# Patient Record
Sex: Female | Born: 1985 | Race: White | Hispanic: No | Smoking: Former smoker
Health system: Southern US, Community
[De-identification: ages and names within clinical notes are randomized; demographics above are authoritative.]

## PROBLEM LIST (undated history)

## (undated) DIAGNOSIS — F419 Anxiety disorder, unspecified: Secondary | ICD-10-CM

## (undated) DIAGNOSIS — Z8619 Personal history of other infectious and parasitic diseases: Secondary | ICD-10-CM

## (undated) DIAGNOSIS — B019 Varicella without complication: Secondary | ICD-10-CM

## (undated) HISTORY — PX: TONSILLECTOMY: SUR1361

## (undated) HISTORY — DX: Personal history of other infectious and parasitic diseases: Z86.19

## (undated) HISTORY — DX: Anxiety disorder, unspecified: F41.9

## (undated) HISTORY — DX: Varicella without complication: B01.9

---

## 2004-12-03 ENCOUNTER — Emergency Department: Payer: Self-pay | Admitting: Emergency Medicine

## 2007-08-25 ENCOUNTER — Emergency Department: Payer: Self-pay | Admitting: Emergency Medicine

## 2008-05-19 ENCOUNTER — Emergency Department: Payer: Self-pay | Admitting: Internal Medicine

## 2009-04-17 ENCOUNTER — Emergency Department: Payer: Self-pay | Admitting: Emergency Medicine

## 2009-04-23 ENCOUNTER — Ambulatory Visit: Payer: Self-pay

## 2009-11-18 ENCOUNTER — Observation Stay: Payer: Self-pay

## 2009-11-19 ENCOUNTER — Inpatient Hospital Stay: Payer: Self-pay

## 2010-04-17 ENCOUNTER — Emergency Department: Payer: Self-pay | Admitting: Emergency Medicine

## 2012-04-22 ENCOUNTER — Emergency Department: Payer: Self-pay | Admitting: Emergency Medicine

## 2013-06-28 ENCOUNTER — Emergency Department: Payer: Self-pay | Admitting: Emergency Medicine

## 2014-01-23 ENCOUNTER — Ambulatory Visit: Payer: Self-pay | Admitting: Adult Health

## 2014-01-30 ENCOUNTER — Ambulatory Visit (INDEPENDENT_AMBULATORY_CARE_PROVIDER_SITE_OTHER): Payer: PRIVATE HEALTH INSURANCE | Admitting: Adult Health

## 2014-01-30 ENCOUNTER — Encounter (INDEPENDENT_AMBULATORY_CARE_PROVIDER_SITE_OTHER): Payer: Self-pay

## 2014-01-30 ENCOUNTER — Encounter: Payer: Self-pay | Admitting: Adult Health

## 2014-01-30 VITALS — BP 126/72 | HR 82 | Temp 98.0°F | Resp 12 | Ht 63.75 in | Wt 204.5 lb

## 2014-01-30 DIAGNOSIS — Z0000 Encounter for general adult medical examination without abnormal findings: Secondary | ICD-10-CM

## 2014-01-30 DIAGNOSIS — R3915 Urgency of urination: Secondary | ICD-10-CM

## 2014-01-30 DIAGNOSIS — F419 Anxiety disorder, unspecified: Secondary | ICD-10-CM

## 2014-01-30 DIAGNOSIS — N926 Irregular menstruation, unspecified: Secondary | ICD-10-CM

## 2014-01-30 DIAGNOSIS — E669 Obesity, unspecified: Secondary | ICD-10-CM

## 2014-01-30 DIAGNOSIS — F411 Generalized anxiety disorder: Secondary | ICD-10-CM

## 2014-01-30 LAB — CBC WITH DIFFERENTIAL/PLATELET
BASOS PCT: 0.8 % (ref 0.0–3.0)
Basophils Absolute: 0.1 10*3/uL (ref 0.0–0.1)
EOS PCT: 2.4 % (ref 0.0–5.0)
Eosinophils Absolute: 0.2 10*3/uL (ref 0.0–0.7)
HCT: 41.7 % (ref 36.0–46.0)
Hemoglobin: 13.8 g/dL (ref 12.0–15.0)
LYMPHS PCT: 27.7 % (ref 12.0–46.0)
Lymphs Abs: 1.8 10*3/uL (ref 0.7–4.0)
MCHC: 33.1 g/dL (ref 30.0–36.0)
MCV: 87.4 fl (ref 78.0–100.0)
Monocytes Absolute: 0.6 10*3/uL (ref 0.1–1.0)
Monocytes Relative: 9 % (ref 3.0–12.0)
Neutro Abs: 3.8 10*3/uL (ref 1.4–7.7)
Neutrophils Relative %: 60.1 % (ref 43.0–77.0)
Platelets: 236 10*3/uL (ref 150.0–400.0)
RBC: 4.77 Mil/uL (ref 3.87–5.11)
RDW: 12.3 % (ref 11.5–14.6)
WBC: 6.3 10*3/uL (ref 4.5–10.5)

## 2014-01-30 LAB — COMPREHENSIVE METABOLIC PANEL
ALT: 16 U/L (ref 0–35)
AST: 17 U/L (ref 0–37)
Albumin: 4.1 g/dL (ref 3.5–5.2)
Alkaline Phosphatase: 57 U/L (ref 39–117)
BILIRUBIN TOTAL: 0.6 mg/dL (ref 0.3–1.2)
BUN: 10 mg/dL (ref 6–23)
CALCIUM: 9.4 mg/dL (ref 8.4–10.5)
CHLORIDE: 101 meq/L (ref 96–112)
CO2: 26 meq/L (ref 19–32)
Creatinine, Ser: 0.6 mg/dL (ref 0.4–1.2)
GFR: 127.13 mL/min (ref >60.00)
Glucose, Bld: 83 mg/dL (ref 70–99)
POTASSIUM: 4 meq/L (ref 3.5–5.1)
SODIUM: 135 meq/L (ref 135–145)
TOTAL PROTEIN: 7 g/dL (ref 6.0–8.3)

## 2014-01-30 LAB — POCT URINALYSIS DIPSTICK
BILIRUBIN UA: NEGATIVE
Glucose, UA: NEGATIVE
KETONES UA: NEGATIVE
Leukocytes, UA: NEGATIVE
Nitrite, UA: NEGATIVE
PH UA: 7
Protein, UA: NEGATIVE
SPEC GRAV UA: 1.025
Urobilinogen, UA: 0.2

## 2014-01-30 LAB — MAGNESIUM: MAGNESIUM: 1.8 mg/dL (ref 1.5–2.5)

## 2014-01-30 LAB — VITAMIN B12: Vitamin B-12: 999 pg/mL — ABNORMAL HIGH (ref 211–911)

## 2014-01-30 LAB — TSH: TSH: 3.48 u[IU]/mL (ref 0.35–5.50)

## 2014-01-30 MED ORDER — BUSPIRONE HCL 7.5 MG PO TABS: 7.5000 mg | ORAL_TABLET | Freq: Two times a day (BID) | ORAL | Status: DC | PRN

## 2014-01-30 NOTE — Progress Notes (Signed)
Patient ID: Pamela Schmidt, female   DOB: 29-Dec-1985, 28 y.o.   MRN: [id number]    Subjective:    Patient ID: Pamela Schmidt, female    DOB: 03/22/1986, 28 y.o.   MRN: [id number]  HPI  Pt is a pleasant 28 y/o female who presents to clinic to establish care. She has not had a PCP in years. She was followed at [facility] but has not been there in approximately 4 years. Last PAP at that time. She has a few concerns. Reports stressful work. Uncontrolled anxiety. She can be sitting quietly when she will feel increasing anxiety, heart races and gets tightness in her chest. No syncope. She uses breathing exercises to try to help her symptoms. She reports drinking 3-4 cans of diet soda per day.   She reports urinary frequency, urgency, nocturia. She has been having similar symptoms since her last pregnancy. She find that she is always running to the bathroom. No hematuria or dysuria. Strains to force urine out. When she first wakes up she urinates without any problems. Does not have to strain at that time.   Last period was the third week of January. Does not use any contraception. She reports having home pregnancy test which have been negative. She believes that her missed period is d/t stress.  Pt reports wanting to lose weight and having difficulty doing so. She has been taking diet pills. Reports that she does not feel jittery when she takes medication. Feels that this is helping her and would like to continue. Blood pressure not elevated.   Review of Systems  Constitutional:       Weight gain  HENT: Negative.   Eyes: Negative.   Respiratory: Negative.   Cardiovascular: Positive for palpitations (associated with anxiety).  Gastrointestinal: Negative.   Endocrine: Negative.   Genitourinary: Positive for urgency, frequency and menstrual problem (missed period). Negative for hematuria.       Hesitancy  Musculoskeletal: Negative.   Skin: Negative.   Allergic/Immunologic: Negative.   Neurological:  Negative.   Hematological: Negative.   Psychiatric/Behavioral: The patient is nervous/anxious (denies depression. Increasing stress at work.).        Objective:  There were no vitals taken for this visit.   Physical Exam  Constitutional: She is oriented to person, place, and time. She appears well-developed and well-nourished. No distress.  HENT:  Head: Normocephalic and atraumatic.  Right Ear: External ear normal.  Left Ear: External ear normal.  Nose: Nose normal.  Mouth/Throat: Oropharynx is clear and moist.  Eyes: Conjunctivae and EOM are normal. Pupils are equal, round, and reactive to light.  Neck: Normal range of motion. Neck supple. No tracheal deviation present. No thyromegaly present.  Cardiovascular: Normal rate, regular rhythm, normal heart sounds and intact distal pulses.  Exam reveals no gallop and no friction rub.   No murmur heard. Pulmonary/Chest: Effort normal and breath sounds normal. No respiratory distress. She has no wheezes. She has no rales.  Abdominal: Soft. Bowel sounds are normal. She exhibits no distension and no mass. There is no tenderness. There is no rebound and no guarding.  Musculoskeletal: Normal range of motion. She exhibits no edema and no tenderness.  Lymphadenopathy:    She has no cervical adenopathy.  Neurological: She is alert and oriented to person, place, and time. She has normal reflexes. No cranial nerve deficit. Coordination normal.  Skin: Skin is warm and dry.  Psychiatric: She has a normal mood and affect. Her behavior is normal. Judgment and thought  content normal.       Assessment & Plan:   1. Routine general medical examination at a health care facility Normal physical exam except for problems noted separately. Request records from her OB/GYN. Believes it has been 3-4 years since last PAP. Check routine labs.  - Vit D  25 hydroxy (rtn osteoporosis monitoring) - CBC with Differential - Vitamin B12 - Comprehensive metabolic  panel  2. Urgency of urination Reports problem ongoing since last pregnancy. ? Urethral stricture. Will check to see if she may have a uti causing worsening symptoms. If unknown etiology will refer to urology. - POCT urinalysis dipstick - Urine culture - Urinalysis, Routine w reflex microscopic  3. Anxiety Pt reports increasing stress at work. Check labs. Start buspar. Continue to follow - TSH - Magnesium - Comprehensive metabolic panel  4. Missed period Check for pregnancy. - hCG, serum, qualitative  5. Obesity Phendimetrazine tid. Discussed short term use of medication. Needs to develop new, healthy habits. Increase physical activity, portion control. Continue to follow

## 2014-01-30 NOTE — Progress Notes (Signed)
Pre visit review using our clinic review tool, if applicable. No additional management support is needed unless otherwise documented below in the visit note. 

## 2014-01-31 ENCOUNTER — Encounter: Payer: Self-pay | Admitting: Adult Health

## 2014-01-31 LAB — VITAMIN D 25 HYDROXY (VIT D DEFICIENCY, FRACTURES): Vit D, 25-Hydroxy: 46 ng/mL (ref 30–89)

## 2014-01-31 LAB — HCG, SERUM, QUALITATIVE: Preg, Serum: NEGATIVE

## 2014-02-01 ENCOUNTER — Encounter: Payer: Self-pay | Admitting: Adult Health

## 2014-02-01 DIAGNOSIS — E669 Obesity, unspecified: Secondary | ICD-10-CM | POA: Insufficient documentation

## 2014-02-01 DIAGNOSIS — Z0000 Encounter for general adult medical examination without abnormal findings: Secondary | ICD-10-CM | POA: Insufficient documentation

## 2014-02-01 DIAGNOSIS — F419 Anxiety disorder, unspecified: Secondary | ICD-10-CM | POA: Insufficient documentation

## 2014-02-01 DIAGNOSIS — N926 Irregular menstruation, unspecified: Secondary | ICD-10-CM | POA: Insufficient documentation

## 2014-02-01 DIAGNOSIS — R3915 Urgency of urination: Secondary | ICD-10-CM | POA: Insufficient documentation

## 2014-02-01 LAB — URINE CULTURE
Colony Count: NO GROWTH
Organism ID, Bacteria: NO GROWTH

## 2014-02-02 ENCOUNTER — Telehealth: Payer: Self-pay | Admitting: Adult Health

## 2014-02-02 ENCOUNTER — Telehealth: Payer: Self-pay | Admitting: *Deleted

## 2014-02-02 DIAGNOSIS — R3129 Other microscopic hematuria: Secondary | ICD-10-CM

## 2014-02-02 NOTE — Telephone Encounter (Signed)
Pt was sent a MyChart message requesting that she return to clinic to provide a urine sample to check for microscopic hematuria.

## 2014-02-02 NOTE — Telephone Encounter (Signed)
OK - thanks

## 2014-02-02 NOTE — Telephone Encounter (Signed)
I wasn't able to, it was either the culture or the micro there wasn't enough for both

## 2014-02-16 ENCOUNTER — Telehealth: Payer: Self-pay | Admitting: Adult Health

## 2014-02-16 NOTE — Telephone Encounter (Signed)
Pt was rx'd buspar at last visit.  States she is to come back Friday 3/27 for repeat urine sample.  Pt states she has 1 buspar left and would like a refill.  Pt states she does not want to run out.

## 2014-02-17 ENCOUNTER — Other Ambulatory Visit: Payer: Self-pay | Admitting: Adult Health

## 2014-02-17 MED ORDER — BUSPIRONE HCL 7.5 MG PO TABS: 7.5000 mg | ORAL_TABLET | Freq: Two times a day (BID) | ORAL | Status: DC | PRN

## 2014-02-17 MED ORDER — BUSPIRONE HCL 10 MG PO TABS: 10.0000 mg | ORAL_TABLET | Freq: Two times a day (BID) | ORAL | Status: DC

## 2014-02-17 NOTE — Progress Notes (Signed)
Called pharmacy and cancelled the 7.5mg  Rx

## 2014-02-20 ENCOUNTER — Other Ambulatory Visit: Payer: PRIVATE HEALTH INSURANCE

## 2014-03-06 ENCOUNTER — Encounter: Payer: Self-pay | Admitting: Adult Health

## 2014-03-06 ENCOUNTER — Ambulatory Visit (INDEPENDENT_AMBULATORY_CARE_PROVIDER_SITE_OTHER): Payer: PRIVATE HEALTH INSURANCE | Admitting: Adult Health

## 2014-03-06 VITALS — BP 118/70 | HR 75 | Temp 98.1°F | Wt 203.0 lb

## 2014-03-06 DIAGNOSIS — Z0000 Encounter for general adult medical examination without abnormal findings: Secondary | ICD-10-CM

## 2014-03-06 DIAGNOSIS — Z139 Encounter for screening, unspecified: Secondary | ICD-10-CM | POA: Insufficient documentation

## 2014-03-06 LAB — POCT URINE PREGNANCY: Preg Test, Ur: POSITIVE

## 2014-03-06 NOTE — Progress Notes (Signed)
Pre visit review using our clinic review tool, if applicable. No additional management support is needed unless otherwise documented below in the visit note. 

## 2014-03-06 NOTE — Progress Notes (Signed)
Patient ID: Pamela Schmidt, female   DOB: 05/07/1986, 28 y.o.   MRN: [id number]    Subjective:    Patient ID: Pamela Schmidt, female    DOB: 11-06-1986, 28 y.o.   MRN: [id number]  HPI  Pamela Schmidt is a pleasant 28 y/o female who presents to clinic for her yearly exam. She has missed her period and believes she may be pregnant. Took a home preg test which was positive but would like this rechecked. Doing well overall. Not taking phentermine. She is only using buspar as needed (category B).    Past Medical History  Diagnosis Date  . Chicken pox      Past Surgical History  Procedure Laterality Date  . Cesarean section  2005, 2010     Family History  Problem Relation Age of Onset  . Hypertension Mother   . Diverticulitis Mother   . Diabetes Father   . Diverticulitis Maternal Aunt   . Diverticulitis Maternal Grandmother   . Bipolar disorder Maternal Aunt      History   Social History  . Marital Status: Married    Spouse Name: N/A    Number of Children: N/A  . Years of Education: 14   Occupational History  . Dental Assistant     Pamela Schmidt, [place]   Social History Main Topics  . Smoking status: Former Smoker    Quit date: 01/30/2010  . Smokeless tobacco: Never Used  . Alcohol Use: 0.0 oz/week    3-4 Glasses of wine per week  . Drug Use: No  . Sexual Activity: Yes    Partners: Male   Other Topics Concern  . Not on file   Social History Narrative   Pamela Schmidt was born in [place] and then moved to [place] area when she was in middle school. She lives at home with her husband and children. She works as a Sales executive. She enjoys working outside in the yard and shopping.     Current Outpatient Prescriptions on File Prior to Visit  Medication Sig Dispense Refill  . busPIRone (BUSPAR) 10 MG tablet Take 1 tablet (10 mg total) by mouth 2 (two) times daily.  60 tablet  6  . Phendimetrazine Tartrate 35 MG TABS Take 1 tablet by mouth 3 (three) times daily.       No current  facility-administered medications on file prior to visit.     Review of Systems  Constitutional: Negative.   HENT: Negative.   Eyes: Negative.   Respiratory: Negative.   Cardiovascular: Negative.   Gastrointestinal: Negative.   Endocrine: Negative.   Genitourinary: Negative.   Musculoskeletal: Negative.   Skin: Negative.   Allergic/Immunologic: Negative.   Neurological: Negative.   Hematological: Negative.   Psychiatric/Behavioral: Negative.        Objective:  BP 118/70  Pulse 75  Temp(Src) 98.1 F (36.7 C) (Oral)  Wt 203 lb (92.08 kg)  SpO2 99%  LMP 01/31/2014   Physical Exam  Constitutional: She is oriented to person, place, and time. She appears well-developed and well-nourished. No distress.  HENT:  Head: Normocephalic and atraumatic.  Right Ear: External ear normal.  Left Ear: External ear normal.  Nose: Nose normal.  Mouth/Throat: Oropharynx is clear and moist.  Eyes: Conjunctivae and EOM are normal. Pupils are equal, round, and reactive to light.  Neck: Normal range of motion. Neck supple. No tracheal deviation present. No thyromegaly present.  Cardiovascular: Normal rate, regular rhythm, normal heart sounds and intact distal pulses.  Exam reveals no  gallop and no friction rub.   No murmur heard. Pulmonary/Chest: Effort normal and breath sounds normal. No respiratory distress. She has no wheezes. She has no rales.  Abdominal: Soft. Bowel sounds are normal. She exhibits no distension and no mass. There is no tenderness. There is no rebound and no guarding.  Musculoskeletal: Normal range of motion. She exhibits no edema and no tenderness.  Lymphadenopathy:    She has no cervical adenopathy.  Neurological: She is alert and oriented to person, place, and time. She has normal reflexes. No cranial nerve deficit. Coordination normal.  Skin: Skin is warm and dry.  Psychiatric: She has a normal mood and affect. Her behavior is normal. Judgment and thought content  normal.      Assessment & Plan:   1. Routine general medical examination at a health care facility Normal physical exam including breast. No pelvic/pap this visit. She will have this done at OB/GYN. Return in 1 year for physical and labs.  2. Screening Positive pregnancy. She will be setting up appointment with OB. Would like to try vaginal birth - h/o cessarian  - POCT urine pregnancy

## 2014-03-12 ENCOUNTER — Emergency Department: Payer: Self-pay | Admitting: Emergency Medicine

## 2014-03-12 LAB — CBC
HCT: 39 % (ref 35.0–47.0)
HGB: 12.8 g/dL (ref 12.0–16.0)
MCH: 28.9 pg (ref 26.0–34.0)
MCHC: 32.9 g/dL (ref 32.0–36.0)
MCV: 88 fL (ref 80–100)
Platelet: 215 10*3/uL (ref 150–440)
RBC: 4.44 10*6/uL (ref 3.80–5.20)
RDW: 12.5 % (ref 11.5–14.5)
WBC: 8.1 10*3/uL (ref 3.6–11.0)

## 2014-03-12 LAB — HCG, QUANTITATIVE, PREGNANCY: Beta Hcg, Quant.: 5352 m[IU]/mL — ABNORMAL HIGH

## 2014-09-05 DIAGNOSIS — Z810 Family history of intellectual disabilities: Secondary | ICD-10-CM | POA: Insufficient documentation

## 2014-09-05 DIAGNOSIS — Z98891 History of uterine scar from previous surgery: Secondary | ICD-10-CM | POA: Insufficient documentation

## 2014-09-05 DIAGNOSIS — IMO0002 Reserved for concepts with insufficient information to code with codable children: Secondary | ICD-10-CM | POA: Insufficient documentation

## 2014-11-10 DIAGNOSIS — Z3490 Encounter for supervision of normal pregnancy, unspecified, unspecified trimester: Secondary | ICD-10-CM | POA: Insufficient documentation

## 2015-05-06 ENCOUNTER — Ambulatory Visit: Payer: Self-pay | Admitting: Podiatry

## 2016-02-10 IMAGING — US US OB < 14 WEEKS - US OB TV
1 series · 14 of 28 positions shown · non-contrast
Comparison: None.

CLINICAL DATA: Cramping, vaginal bleeding

EXAM:
OBSTETRIC <14 WK US AND TRANSVAGINAL OB US
TECHNIQUE: Both transabdominal and transvaginal ultrasound examinations were
performed for complete evaluation of the gestation as well as the
maternal uterus, adnexal regions, and pelvic cul-de-sac.
Transvaginal technique was performed to assess early pregnancy.

[Series 1: us ob < 14 weeks - us ob tv · 0.21mm/px · 14 of 32 slices shown]
[im 2/32]
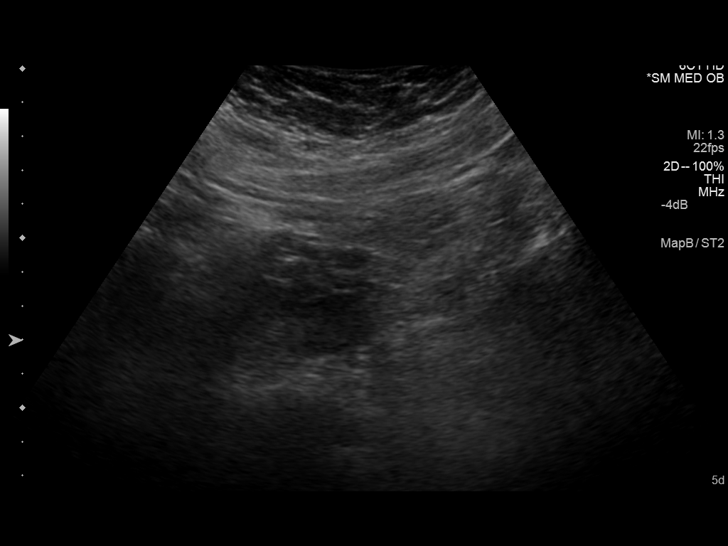
[im 4/32]
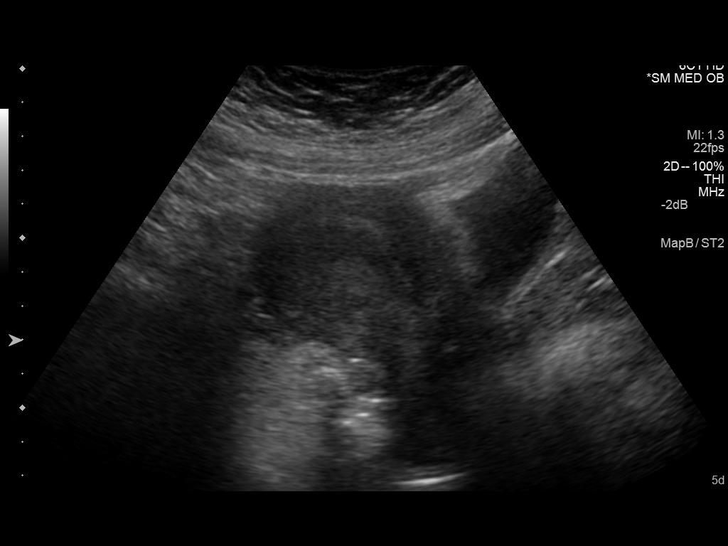
[im 6/32]
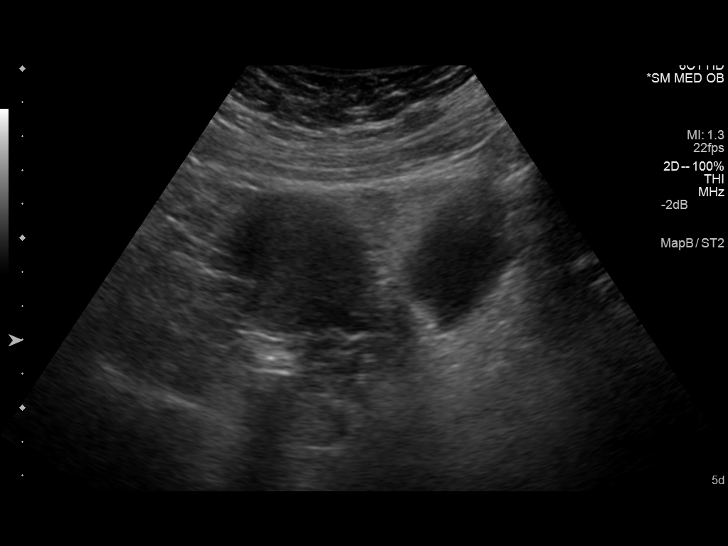
[im 9/32]
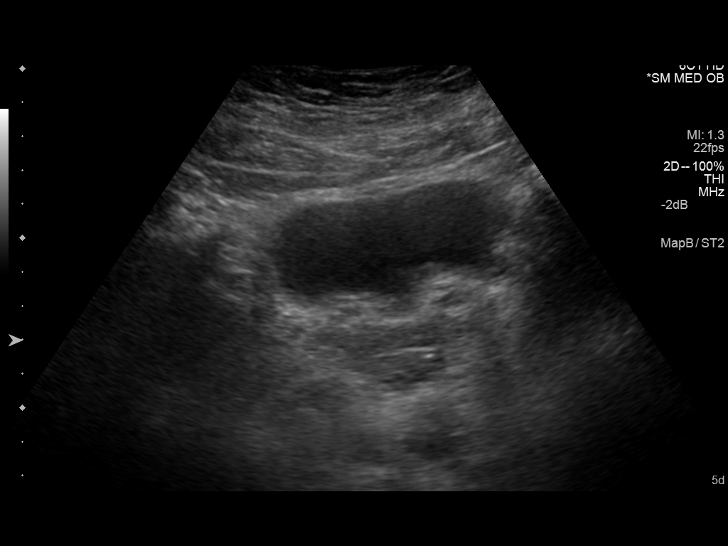
[im 11/32]
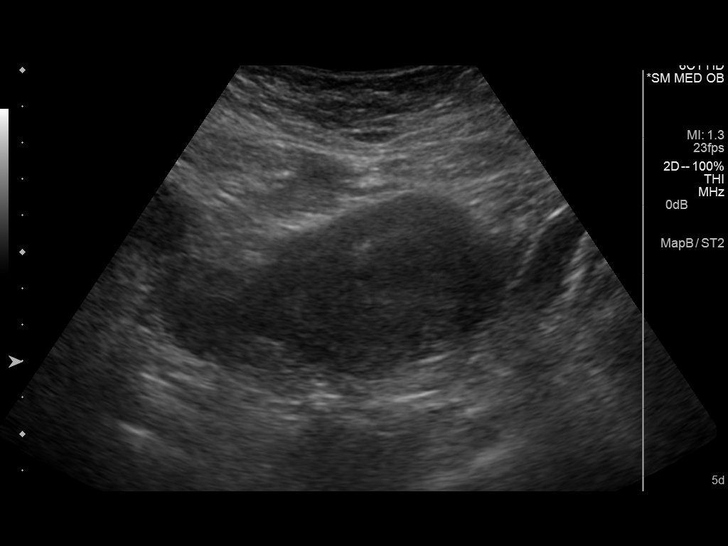
[im 13/32]
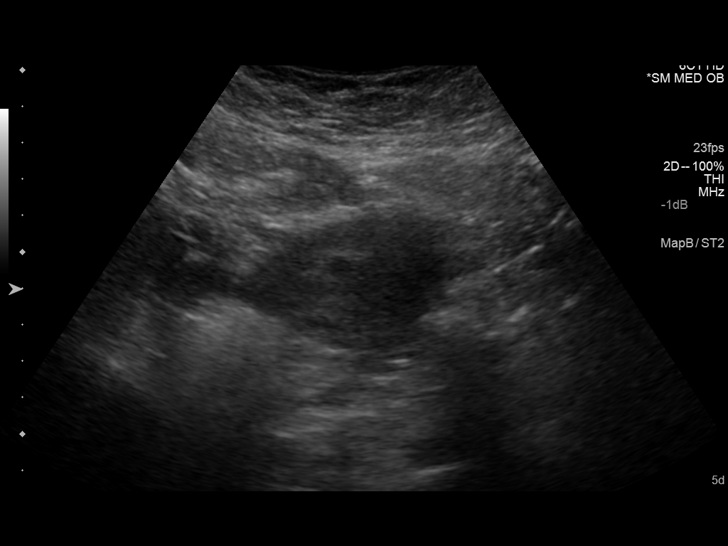
[im 15/32]
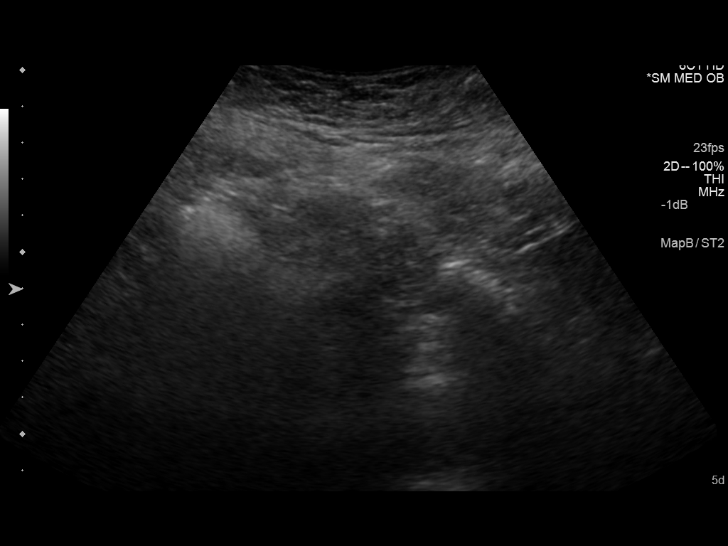
[im 18/32]
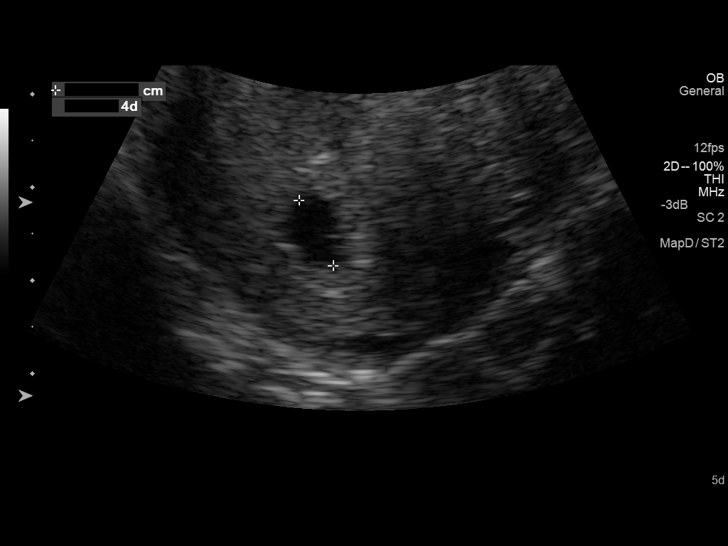
[im 20/32]
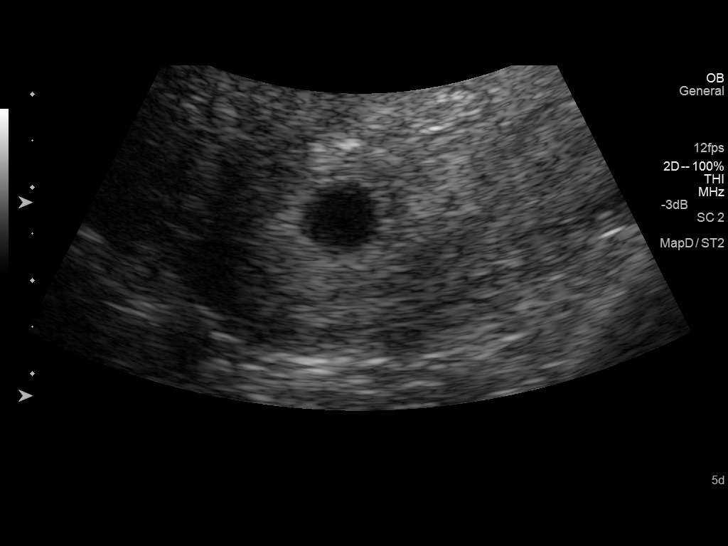
[im 22/32]
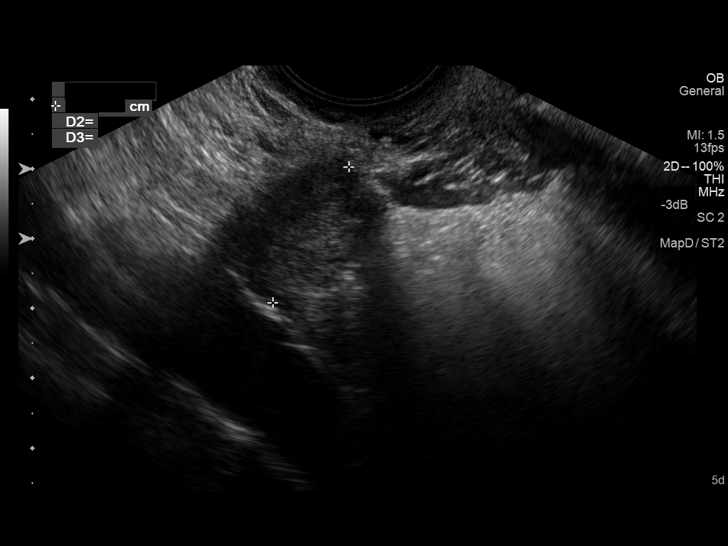
[im 25/32]
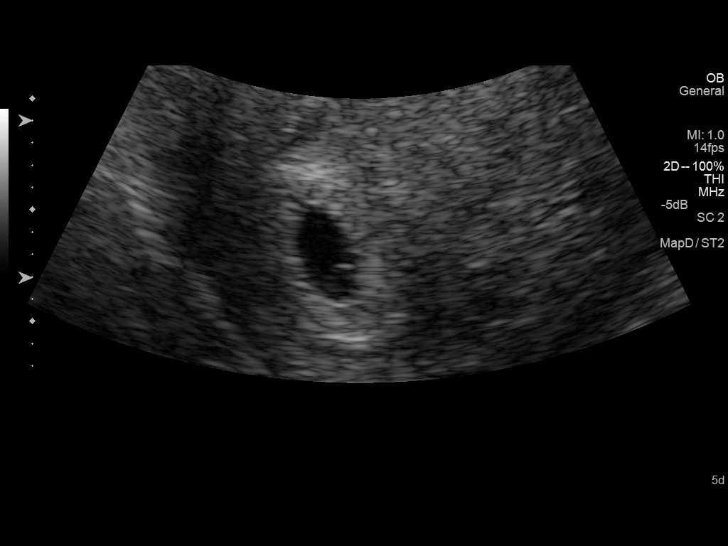
[im 27/32]
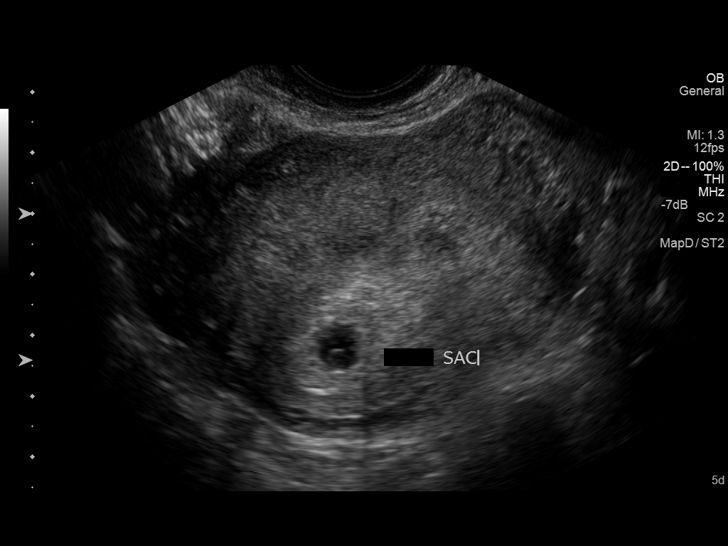
[im 29/32]
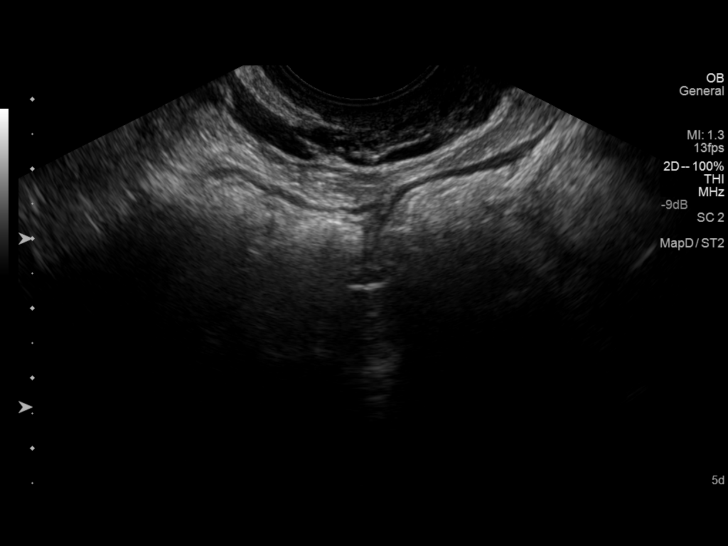
[im 32/32]
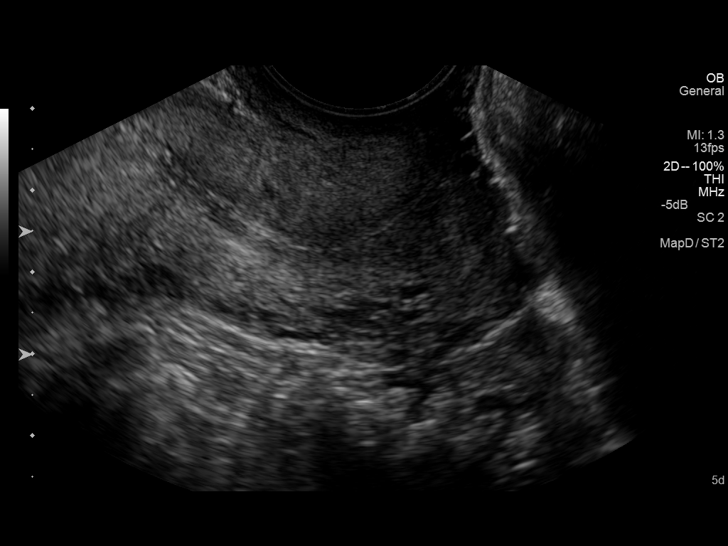

[14 of 28 positions shown; findings below may reference images not displayed]

FINDINGS: Intrauterine gestational sac: Visualized/normal in shape.

Yolk sac:  Present.

Embryo:  None.

Cardiac Activity: None.

MSD: 7.9 mm   5 w   4  d          US EDC: 11/08/2014

Maternal uterus/adnexae: The right ovary is normal in size and
echogenicity. The right ovary measures 2.2 x 2.4 x 2.1 cm. The left
ovary is not visualized. There is no pelvic free fluid.
IMPRESSION: Intrauterine gestational sac and yolk sac without a visualized fetal
pole. Differential diagnosis includes a blighted ovum versus early
pregnancy. Recommend clinical correlation, serial quantitative beta
HCGs, ectopic precautions, and followup ultrasound in 14 days.

## 2016-05-05 ENCOUNTER — Encounter: Payer: Self-pay | Admitting: Sports Medicine

## 2016-05-05 ENCOUNTER — Ambulatory Visit (INDEPENDENT_AMBULATORY_CARE_PROVIDER_SITE_OTHER): Payer: BLUE CROSS/BLUE SHIELD

## 2016-05-05 ENCOUNTER — Ambulatory Visit (INDEPENDENT_AMBULATORY_CARE_PROVIDER_SITE_OTHER): Payer: BLUE CROSS/BLUE SHIELD | Admitting: Sports Medicine

## 2016-05-05 DIAGNOSIS — M7732 Calcaneal spur, left foot: Secondary | ICD-10-CM | POA: Diagnosis not present

## 2016-05-05 DIAGNOSIS — M722 Plantar fascial fibromatosis: Secondary | ICD-10-CM | POA: Diagnosis not present

## 2016-05-05 DIAGNOSIS — M79672 Pain in left foot: Secondary | ICD-10-CM

## 2016-05-05 MED ORDER — METHYLPREDNISOLONE 4 MG PO TBPK: ORAL_TABLET | ORAL | Status: DC

## 2016-05-05 MED ORDER — DICLOFENAC SODIUM 75 MG PO TBEC: 75.0000 mg | DELAYED_RELEASE_TABLET | Freq: Two times a day (BID) | ORAL | Status: DC

## 2016-05-05 NOTE — Progress Notes (Signed)
Patient ID: Pamela Schmidt, female   DOB: 01/22/1986, 30 y.o.   MRN: [id number] Subjective: Pamela Schmidt is a 30 y.o. female patient presents to office with complaint of heel pain on the left. Patient admits to post static dyskinesia for 3 months in duration, 10/10. Patient has treated this problem with stretching, massage, and motrin with no relief. States that this all started after doing a P90x workout in flip flops. Denies any other pedal complaints.   Patient Active Problem List   Diagnosis Date Noted  . Currently pregnant 11/10/2014  . Family history of intellectual disabilities 09/05/2014  . History of cesarean section 09/05/2014  . Adult BMI 30+ 09/05/2014  . Screening 03/06/2014  . Routine general medical examination at a health care facility 02/01/2014  . Urgency of urination 02/01/2014  . Anxiety 02/01/2014  . Missed period 02/01/2014  . Obesity 02/01/2014    No current outpatient prescriptions on file prior to visit.   No current facility-administered medications on file prior to visit.    Allergies  Allergen Reactions  . Penicillin G Rash  . Penicillins Itching and Rash    Objective: Physical Exam General: The patient is alert and oriented x3 in no acute distress.  Dermatology: Skin is warm, dry and supple bilateral lower extremities. Nails 1-10 are normal. There is no erythema, edema, no eccymosis, no open lesions present. Integument is otherwise unremarkable.  Vascular: Dorsalis Pedis pulse and Posterior Tibial pulse are 2/4 bilateral. Capillary fill time is immediate to all digits.  Neurological: Grossly intact to light touch with an achilles reflex of +2/5 and a  negative Tinel's sign bilateral.  Musculoskeletal: Tenderness to palpation at the medial calcaneal tubercale and through the insertion of the plantar fascia on the left foot. No pain with compression of calcaneus bilateral. No pain with tuning fork to calcaneus bilateral. No pain with calf compression  bilateral. There is decreased Ankle joint range of motion bilateral. All other joints range of motion within normal limits bilateral. Strength 5/5 in all groups bilateral.   Xray, Left foot:  Normal osseous mineralization. Joint spaces preserved. No fracture/dislocation/boney destruction. Calcaneal spur present with mild thickening of plantar fascia. No other soft tissue abnormalities or radiopaque foreign bodies.   Assessment and Plan: Problem List Items Addressed This Visit    None    Visit Diagnoses    Left foot pain    -  Primary    Relevant Medications    methylPREDNISolone (MEDROL DOSEPAK) 4 MG TBPK tablet    diclofenac (VOLTAREN) 75 MG EC tablet    Other Relevant Orders    DG Foot 2 Views Left    Plantar fasciitis, left        Relevant Medications    methylPREDNISolone (MEDROL DOSEPAK) 4 MG TBPK tablet    diclofenac (VOLTAREN) 75 MG EC tablet    Heel spur, left          -Complete examination performed.  -Xrays reviewed -Discussed with patient in detail the condition of plantar fasciitis, how this occurs and general treatment options. Explained both conservative and surgical treatments.  -Patient will return for injection; had her children with her this visit -Rx Diclofenac to start after Medrol dose pack is completed -Recommended good supportive shoes and advised use of OTC insert. Explained to patient that if these orthoses work well, we will continue with these. If these do not improve her condition and  pain, we will consider custom molded orthoses. -Explained in detail the use of  the fascial brace for left which was dispensed at today's visit. -Explained and dispensed to patient daily stretching exercises. -Recommend patient to ice affected area 1-2x daily. -Patient to return to office in 2 weeks for follow up/injection or sooner if problems or questions arise.  Pamela Schmidt, DPM

## 2016-05-05 NOTE — Patient Instructions (Signed)

## 2016-05-17 ENCOUNTER — Telehealth: Payer: Self-pay | Admitting: Sports Medicine

## 2016-05-17 DIAGNOSIS — M79672 Pain in left foot: Secondary | ICD-10-CM

## 2016-05-17 DIAGNOSIS — M722 Plantar fascial fibromatosis: Secondary | ICD-10-CM

## 2016-05-17 MED ORDER — DICLOFENAC SODIUM 75 MG PO TBEC: 75.0000 mg | DELAYED_RELEASE_TABLET | Freq: Two times a day (BID) | ORAL | Status: DC

## 2016-05-17 NOTE — Addendum Note (Signed)
Addended by: Alphia Kava'CONNELL, Elisa Kutner D on: 05/17/2016 10:06 AM   Modules accepted: Orders

## 2016-05-17 NOTE — Telephone Encounter (Signed)
Pt is finished with her meds that Dr. Marylene LandStover put her on and her feet are hurting again wanting to know if she can get another refill on that medicine

## 2016-05-17 NOTE — Telephone Encounter (Addendum)
Pt continues to have foot pain after completing antiinflammatories.  Left message for pt to make sure she came to her appt, I had refilled the Diclofenac for 1 week and ice therapy.

## 2016-05-19 ENCOUNTER — Encounter: Payer: Self-pay | Admitting: Sports Medicine

## 2016-05-19 ENCOUNTER — Ambulatory Visit (INDEPENDENT_AMBULATORY_CARE_PROVIDER_SITE_OTHER): Payer: BLUE CROSS/BLUE SHIELD | Admitting: Sports Medicine

## 2016-05-19 DIAGNOSIS — M79672 Pain in left foot: Secondary | ICD-10-CM | POA: Diagnosis not present

## 2016-05-19 DIAGNOSIS — M722 Plantar fascial fibromatosis: Secondary | ICD-10-CM

## 2016-05-19 DIAGNOSIS — M7732 Calcaneal spur, left foot: Secondary | ICD-10-CM | POA: Diagnosis not present

## 2016-05-19 MED ORDER — TRIAMCINOLONE ACETONIDE 10 MG/ML IJ SUSP: 10.0000 mg | Freq: Once | INTRAMUSCULAR | Status: DC

## 2016-05-19 NOTE — Progress Notes (Signed)
Patient ID: Geoffry ParadiseAmber J Sanroman, female   DOB: 12/14/1985, 30 y.o.   MRN: [id number]  Subjective: Geoffry Paradisember J Clerk is a 30 y.o. female patient returns to office with complaint of heel pain on the left. Patient states that she felt great for about a week while she was on steroids medicine and when she went off her steroid medication then started to have some pain back to the level where it originally was. Reports that the diclofenac helps a little bit. However, did not feel as great as when she was on her steroid medicine. Patient is here today for steroid injection. Denies any other pedal complaints.   Patient Active Problem List   Diagnosis Date Noted  . Currently pregnant 11/10/2014  . Family history of intellectual disabilities 09/05/2014  . History of cesarean section 09/05/2014  . Adult BMI 30+ 09/05/2014  . Screening 03/06/2014  . Routine general medical examination at a health care facility 02/01/2014  . Urgency of urination 02/01/2014  . Anxiety 02/01/2014  . Missed period 02/01/2014  . Obesity 02/01/2014    Current Outpatient Prescriptions on File Prior to Visit  Medication Sig Dispense Refill  . diclofenac (VOLTAREN) 75 MG EC tablet Take 1 tablet (75 mg total) by mouth 2 (two) times daily. 30 tablet 0  . diclofenac (VOLTAREN) 75 MG EC tablet Take 1 tablet (75 mg total) by mouth 2 (two) times daily. 15 tablet 0  . methylPREDNISolone (MEDROL DOSEPAK) 4 MG TBPK tablet Take first as instructed 21 tablet 0   No current facility-administered medications on file prior to visit.    Allergies  Allergen Reactions  . Penicillin G Rash  . Penicillins Itching and Rash    Objective: Physical Exam General: The patient is alert and oriented x3 in no acute distress.  Dermatology: Skin is warm, dry and supple bilateral lower extremities. Nails 1-10 are normal. There is no erythema, edema, no eccymosis, no open lesions present. Integument is otherwise unremarkable.  Vascular: Dorsalis Pedis pulse  and Posterior Tibial pulse are 2/4 bilateral. Capillary fill time is immediate to all digits.  Neurological: Grossly intact to light touch with an achilles reflex of +2/5 and a  negative Tinel's sign bilateral.  Musculoskeletal: Tenderness to palpation at the medial calcaneal tubercale and through the insertion of the plantar fascia on the left foot. No pain with compression of calcaneus bilateral. No pain with tuning fork to calcaneus bilateral. No pain with calf compression bilateral. There is decreased Ankle joint range of motion bilateral. All other joints range of motion within normal limits bilateral. Strength 5/5 in all groups bilateral.    Assessment and Plan: Problem List Items Addressed This Visit    None    Visit Diagnoses    Plantar fasciitis, left    -  Primary    Relevant Medications    triamcinolone acetonide (KENALOG) 10 MG/ML injection 10 mg    Heel spur, left        Relevant Medications    triamcinolone acetonide (KENALOG) 10 MG/ML injection 10 mg    Left foot pain        Relevant Medications    triamcinolone acetonide (KENALOG) 10 MG/ML injection 10 mg      -Complete examination performed.  -Discussed with patient in detail the condition of plantar fasciitis, how this occurs and general treatment options. Explained both conservative and surgical treatments.  After oral consent and aseptic prep, injected a mixture containing 1 ml of 2%  plain lidocaine, 1 ml 0.5%  plain marcaine, 0.5 ml of kenalog 10 and 0.5 ml of dexamethasone phosphate into Left heel without complication. Post-injection care discussed with patient.  -Finish dose pack and diclofenac as prescribed -Recommended good supportive shoes and advised use of OTC insert. Explained to patient that if these orthoses work well, we will continue with these. If these do not improve her condition and  pain, we will consider custom molded orthoses. -Continue with plantar fascial brace may add night splint, If continues  to be symptomatic -Continue with daily stretching exercises. -Recommend patient to continue to ice affected area 1-2x daily. -Patient to return to office in 4 weeks for follow up evaluation or sooner if problems or questions arise.  Asencion Islamitorya Theodore Virgin, DPM

## 2016-06-14 ENCOUNTER — Other Ambulatory Visit: Payer: Self-pay | Admitting: Sports Medicine

## 2016-06-15 NOTE — Telephone Encounter (Signed)
Pt needs to make an appt prior to refills.

## 2016-06-23 ENCOUNTER — Encounter: Payer: Self-pay | Admitting: Sports Medicine

## 2016-06-23 ENCOUNTER — Ambulatory Visit: Payer: BLUE CROSS/BLUE SHIELD | Admitting: Sports Medicine

## 2016-08-04 ENCOUNTER — Encounter: Payer: Self-pay | Admitting: Podiatry

## 2016-08-04 ENCOUNTER — Ambulatory Visit (INDEPENDENT_AMBULATORY_CARE_PROVIDER_SITE_OTHER): Payer: BLUE CROSS/BLUE SHIELD | Admitting: Podiatry

## 2016-08-04 DIAGNOSIS — M722 Plantar fascial fibromatosis: Secondary | ICD-10-CM

## 2016-08-04 DIAGNOSIS — M79672 Pain in left foot: Secondary | ICD-10-CM

## 2016-08-04 MED ORDER — METHYLPREDNISOLONE 4 MG PO TBPK: ORAL_TABLET | ORAL | 0 refills | Status: DC

## 2016-08-04 MED ORDER — BETAMETHASONE SOD PHOS & ACET 6 (3-3) MG/ML IJ SUSP: 12.0000 mg | Freq: Once | INTRAMUSCULAR | Status: DC

## 2016-08-04 NOTE — Patient Instructions (Addendum)

## 2016-08-04 NOTE — Progress Notes (Signed)
Subjective: Is a [age] year old female patient who presents today to the clinic for follow-up evaluation of heel pain on the left lower extremity. She states that her initial injury was back in May when she didn't P 90 X exercise with her friend. This high intensity high impact exercise program elicited the pain in her left heel and for the past 6 months she's had no alleviation of symptoms. Patient somewhat frustrated with the symptomology and aggression of the plantar fasciitis of left foot. Patient presents today for follow-up further treatment and evaluation  Patient has tried conservative modalities including previous injections plantar fascial strapping diclofenac anti-inflammatories Medrol Dosepaks icing and stretching and supportive shoe gear. All of these treatment modalities seem to have minimal beneficial effect.  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   Objective: Physical Exam General: The patient is alert and oriented x3 in no acute distress.  Dermatology: Skin is warm, dry and supple bilateral lower extremities. Negative for open lesions or macerations.  Vascular: Palpable pedal pulses bilaterally. No edema or erythema noted. Capillary refill within normal limits.  Neurological: Epicritic and protective threshold grossly intact bilaterally.   Musculoskeletal Exam: Severe pain on palpation to the plantar medial tubercle of the left calcaneus at the insertion of the plantar fascia.  Range of motion within normal limits to all pedal and ankle joints bilateral. Muscle strength 5/5 in all groups bilateral.    Assessment: #1 plantar fasciitis left foot #2 pain and left foot #3 inflammatory edema left foot Problem List Items Addressed This Visit    None    Visit Diagnoses    Plantar fasciitis of left foot    -  Primary   Relevant Medications   betamethasone acetate-betamethasone sodium phosphate (CELESTONE) injection 12 mg (Start on 08/04/2016  3:30 PM)   Pain in left foot        Relevant Medications   betamethasone acetate-betamethasone sodium phosphate (CELESTONE) injection 12 mg (Start on 08/04/2016  3:30 PM)        Plan of Care:  #1 Patient was evaluated. #2 Injection of 0.5 mL Celestone Soluspan injected into the left plantar fascia #3 prescription for Medrol Dosepak was dispensed #4 prescription for Motrin 800 with stomach protector was sent through [facility] #5 physical therapy prescribed [facility] [place]. #6 night splint dispensed the patient #7 patient is to return to clinic in 4 weeks     Dr. Felecia ShellingBrent M. Excell Neyland, DPM [facility]

## 2016-09-01 ENCOUNTER — Ambulatory Visit: Payer: BLUE CROSS/BLUE SHIELD | Admitting: Podiatry

## 2016-09-01 ENCOUNTER — Telehealth: Payer: Self-pay | Admitting: Adult Health

## 2016-09-01 NOTE — Telephone Encounter (Signed)
Noted  

## 2016-09-01 NOTE — Telephone Encounter (Signed)
FYI - Former pt of [facility]. PT scheduled appt for est care with Dr. Adriana Simasook on 09/29/16 and is coming in on 09/14/16 with Dr. Birdie SonsSonnenberg for an acute issue.  Thank you!

## 2016-09-14 ENCOUNTER — Ambulatory Visit: Payer: PRIVATE HEALTH INSURANCE | Admitting: Family Medicine

## 2016-09-14 DIAGNOSIS — Z0289 Encounter for other administrative examinations: Secondary | ICD-10-CM

## 2016-09-29 ENCOUNTER — Ambulatory Visit: Payer: PRIVATE HEALTH INSURANCE | Admitting: Family Medicine

## 2016-10-27 ENCOUNTER — Telehealth: Payer: Self-pay | Admitting: Family Medicine

## 2016-10-27 ENCOUNTER — Ambulatory Visit: Payer: PRIVATE HEALTH INSURANCE | Admitting: Family Medicine

## 2016-10-27 DIAGNOSIS — Z0289 Encounter for other administrative examinations: Secondary | ICD-10-CM

## 2016-10-27 NOTE — Telephone Encounter (Signed)
FYI

## 2016-10-27 NOTE — Telephone Encounter (Signed)
Lm on vm to reschedule appt that was a NO SHOW on 10/27/16... Pt has also had a NO SHOWED on 09/14/16 and cancelled appt on 09/29/16.Marland Kitchen.  pt has one more (which will be the 3rd)  NO SHOW a letter will be sent out after the 4th NO SHOW dismissal letter will be sent out.

## 2018-11-27 NOTE — L&D Delivery Note (Signed)
Delivery Note At 0007am  a non-viable and severely premature female was delivered vaginally in the sac with placenta .  APGAR:0 ,0 ;   .   Placenta status: , .  Cord:  with the following complications: tight [place] x3 with arm and leg entangled in cord  Anesthesia:  none Episiotomy:  NA Lacerations:  none Suture Repair: NA Est. Blood Loss (mL):  150  Mom to home after stable.  Baby to [place].  Pamela Schmidt N Pamela Schmidt 03/19/2019, 12:23 AM

## 2018-12-29 ENCOUNTER — Emergency Department: Payer: Medicaid Other

## 2018-12-29 ENCOUNTER — Other Ambulatory Visit: Payer: Self-pay

## 2018-12-29 ENCOUNTER — Emergency Department
Admission: EM | Admit: 2018-12-29 | Discharge: 2018-12-29 | Disposition: A | Discharge status: Home / Self Care | Payer: Medicaid Other | Attending: Emergency Medicine | Admitting: Emergency Medicine

## 2018-12-29 DIAGNOSIS — O200 Threatened abortion: Secondary | ICD-10-CM | POA: Diagnosis not present

## 2018-12-29 DIAGNOSIS — Z3A01 Less than 8 weeks gestation of pregnancy: Secondary | ICD-10-CM | POA: Insufficient documentation

## 2018-12-29 DIAGNOSIS — N939 Abnormal uterine and vaginal bleeding, unspecified: Secondary | ICD-10-CM

## 2018-12-29 DIAGNOSIS — Z87891 Personal history of nicotine dependence: Secondary | ICD-10-CM | POA: Insufficient documentation

## 2018-12-29 DIAGNOSIS — O209 Hemorrhage in early pregnancy, unspecified: Secondary | ICD-10-CM | POA: Diagnosis not present

## 2018-12-29 LAB — BASIC METABOLIC PANEL
Anion gap: 5 (ref 5–15)
BUN: 14 mg/dL (ref 6–20)
CO2: 26 mmol/L (ref 22–32)
Calcium: 8.4 mg/dL — ABNORMAL LOW (ref 8.9–10.3)
Chloride: 106 mmol/L (ref 98–111)
Creatinine, Ser: 0.46 mg/dL (ref 0.44–1.00)
GFR calc Af Amer: >60 mL/min (ref >60)
Glucose, Bld: 95 mg/dL (ref 70–99)
Potassium: 3.4 mmol/L — ABNORMAL LOW (ref 3.5–5.1)
Sodium: 137 mmol/L (ref 135–145)

## 2018-12-29 LAB — CBC WITH DIFFERENTIAL/PLATELET
ABS IMMATURE GRANULOCYTES: 0.05 10*3/uL (ref 0.00–0.07)
BASOS ABS: 0 10*3/uL (ref 0.0–0.1)
BASOS PCT: 1 %
Eosinophils Absolute: 0.1 10*3/uL (ref 0.0–0.5)
Eosinophils Relative: 2 %
HCT: 41 % (ref 36.0–46.0)
HEMOGLOBIN: 13.6 g/dL (ref 12.0–15.0)
Immature Granulocytes: 1 %
Lymphocytes Relative: 19 %
Lymphs Abs: 1.5 10*3/uL (ref 0.7–4.0)
MCH: 29.8 pg (ref 26.0–34.0)
MCHC: 33.2 g/dL (ref 30.0–36.0)
MCV: 89.7 fL (ref 80.0–100.0)
Monocytes Absolute: 0.7 10*3/uL (ref 0.1–1.0)
Monocytes Relative: 9 %
NRBC: 0 % (ref 0.0–0.2)
Neutro Abs: 5.7 10*3/uL (ref 1.7–7.7)
Neutrophils Relative %: 68 %
Platelets: 234 10*3/uL (ref 150–400)
RBC: 4.57 MIL/uL (ref 3.87–5.11)
RDW: 12.5 % (ref 11.5–15.5)
WBC: 8.1 10*3/uL (ref 4.0–10.5)

## 2018-12-29 LAB — HCG, QUANTITATIVE, PREGNANCY: hCG, Beta Chain, Quant, S: 68543 m[IU]/mL — ABNORMAL HIGH (ref <5)

## 2018-12-29 LAB — PREGNANCY, URINE: Preg Test, Ur: POSITIVE — AB

## 2018-12-29 NOTE — Discharge Instructions (Addendum)
Follow-up with your OB/GYN as scheduled.  Return to the ER for new or worsening bleeding, pain, weakness, or any other new or worsening symptoms that concern you.

## 2018-12-29 NOTE — ED Triage Notes (Signed)
Pt comes via POV from home with c/o lower back pain, vaginal bleeding and cramping that started last night. Pt states that she is [redacted] weeks pregnant. Pt states first OBGYN appt is in a week.  Pt states spotting but severe back and cramping that has her worried.

## 2018-12-29 NOTE — ED Provider Notes (Signed)
[facility] Emergency Department Provider Note ____________________________________________   First MD Initiated Contact with Patient 12/29/18 1122     (approximate)  I have reviewed the triage vital signs and the nursing notes.   HISTORY  Chief Complaint Vaginal Bleeding    HPI Pamela Schmidt is a 33 y.o. female G5P3 at 7 weeks 5 days by LMP who presents with vaginal bleeding since last night, described as a small amount of spotting, but associated with crampy lower abdominal and back pain.  The patient also reports nausea but this is not new over the last day.  She denies any vomiting, fever, or weakness.  Past Medical History:  Diagnosis Date  . Chicken pox     Patient Active Problem List   Diagnosis Date Noted  . Currently pregnant 11/10/2014  . Family history of intellectual disabilities 09/05/2014  . History of cesarean section 09/05/2014  . Adult BMI 30+ 09/05/2014  . Screening 03/06/2014  . Routine general medical examination at a health care facility 02/01/2014  . Urgency of urination 02/01/2014  . Anxiety 02/01/2014  . Missed period 02/01/2014  . Obesity 02/01/2014    Past Surgical History:  Procedure Laterality Date  . CESAREAN SECTION  2005, 2010    Prior to Admission medications   Medication Sig Start Date End Date Taking? Authorizing Provider  prenatal vitamin w/FE, FA (PRENATAL 1 + 1) 27-1 MG TABS tablet Take 1 tablet by mouth daily at 12 noon.   Yes [provider]  methylPREDNISolone (MEDROL DOSEPAK) 4 MG TBPK tablet 6 day dose pack - take as directed 08/04/16   Felecia Shelling, DPM    Allergies Penicillins  Family History  Problem Relation Age of Onset  . Hypertension Mother   . Diverticulitis Mother   . Diabetes Father   . Diverticulitis Maternal Aunt   . Diverticulitis Maternal Grandmother   . Bipolar disorder Maternal Aunt     Social History Social History   Tobacco Use  . Smoking status: Former  Smoker    Last attempt to quit: 01/30/2010    Years since quitting: 8.9  . Smokeless tobacco: Never Used  Substance Use Topics  . Alcohol use: Yes    Alcohol/week: 3.0 - 4.0 standard drinks    Types: 3 - 4 Glasses of wine per week  . Drug use: No    Review of Systems  Constitutional: No fever. Eyes: No redness. ENT: No sore throat. Cardiovascular: Denies chest pain. Respiratory: Denies shortness of breath. Gastrointestinal: Positive for nausea.  Genitourinary: Negative for dysuria.  Positive for vaginal bleeding. Musculoskeletal: Negative for back pain. Skin: Negative for rash. Neurological: Negative for headache.   ____________________________________________   PHYSICAL EXAM:  VITAL SIGNS: ED Triage Vitals  Enc Vitals Group     BP 12/29/18 1037 131/77     Pulse Rate 12/29/18 1037 91     Resp 12/29/18 1037 15     Temp 12/29/18 1037 98.1 F (36.7 C)     Temp Source 12/29/18 1037 Oral     SpO2 12/29/18 1037 100 %     Weight 12/29/18 1038 177 lb (80.3 kg)     Height 12/29/18 1038 5\' 2"  (1.575 m)     Head Circumference --      Peak Flow --      Pain Score 12/29/18 1048 5     Pain Loc --      Pain Edu? --      Excl. in GC? --  Constitutional: Alert and oriented. Well appearing and in no acute distress. Eyes: Conjunctivae are normal.  Head: Atraumatic. Nose: No congestion/rhinnorhea. Mouth/Throat: Mucous membranes are moist.   Neck: Normal range of motion.  Cardiovascular: Good peripheral circulation. Respiratory: Normal respiratory effort.   Gastrointestinal: Soft and nontender. No distention.  Genitourinary: No flank tenderness. Musculoskeletal: Extremities warm and well perfused.  Neurologic:  Normal speech and language. No gross focal neurologic deficits are appreciated.  Skin:  Skin is warm and dry. No rash noted. Psychiatric: Mood and affect are normal. Speech and behavior are normal.  ____________________________________________   LABS (all labs  ordered are listed, but only abnormal results are displayed)  Labs Reviewed  BASIC METABOLIC PANEL - Abnormal; Notable for the following components:      Result Value   Potassium 3.4 (*)    Calcium 8.4 (*)    All other components within normal limits  PREGNANCY, URINE - Abnormal; Notable for the following components:   Preg Test, Ur POSITIVE (*)    All other components within normal limits  HCG, QUANTITATIVE, PREGNANCY - Abnormal; Notable for the following components:   hCG, Beta Chain, Quant, S 16,10968,543 (*)    All other components within normal limits  CBC WITH DIFFERENTIAL/PLATELET  TYPE AND SCREEN   ____________________________________________  EKG   ____________________________________________  RADIOLOGY  US pelvis transvaginal: Live IUP approximately with FHR  ____________________________________________   PROCEDURES  Procedure(s) performed: No  Procedures  Critical Care performed: No ____________________________________________   INITIAL IMPRESSION / ASSESSMENT AND PLAN / ED COURSE  Pertinent labs & imaging results that were available during my care of the patient were reviewed by me and considered in my medical decision making (see chart for details).  33 year old female G5P3 at 7 weeks 5 days by LMP with PMH as noted above presents with vaginal spotting as well as crampy lower abdominal and back pain since last night.  The patient has not seen her OB/GYN yet for this pregnancy and has not had an ultrasound.  On exam the patient is well-appearing and her vital signs are normal.  The abdomen is soft and nontender.  Differential includes threatened abortion, spontaneous abortion, or less likely ectopic.  We will obtain lab work-up including hCG as well as transvaginal ultrasound and reassess.  ----------------------------------------- 3:51 PM on 12/29/2018 -----------------------------------------  Ultrasound shows live IUP with FHR and consistent with  dates.  The patient has had no further significant bleeding and is comfortable, not requiring any pain medication in the ED.  She is stable for discharge home.  I counseled her on the results of the ultrasound and the diagnosis of threatened miscarriage.  Return precautions given, and she expresses understanding.  She will follow-up with her OB/GYN at [facility] in 1 week. ____________________________________________   FINAL CLINICAL IMPRESSION(S) / ED DIAGNOSES  Final diagnoses:  Threatened miscarriage      NEW MEDICATIONS STARTED DURING THIS VISIT:  New Prescriptions   No medications on file     Note:  This document was prepared using Dragon voice recognition software and may include unintentional dictation errors.    Dionne BucySiadecki, Lestine Rahe, MD 12/29/18 [phone]

## 2019-01-06 ENCOUNTER — Encounter: Payer: Self-pay | Admitting: Certified Nurse Midwife

## 2019-01-06 ENCOUNTER — Ambulatory Visit (INDEPENDENT_AMBULATORY_CARE_PROVIDER_SITE_OTHER): Payer: Medicaid Other | Admitting: Certified Nurse Midwife

## 2019-01-06 VITALS — BP 124/74 | HR 82 | Wt 190.4 lb

## 2019-01-06 DIAGNOSIS — N926 Irregular menstruation, unspecified: Secondary | ICD-10-CM

## 2019-01-06 DIAGNOSIS — Z3A08 8 weeks gestation of pregnancy: Secondary | ICD-10-CM

## 2019-01-06 MED ORDER — DOXYLAMINE-PYRIDOXINE ER 20-20 MG PO TBCR: 1.0000 | EXTENDED_RELEASE_TABLET | Freq: Two times a day (BID) | ORAL | 4 refills | Status: DC

## 2019-01-06 NOTE — Patient Instructions (Signed)

## 2019-01-06 NOTE — Progress Notes (Signed)
Subjective:    Pamela Schmidt is a 33 y.o. female who presents for establishment of care. [facility].   Pregnancy is desired. Sexual Activity: single partner, contraception: none. Current symptoms also include: nausea. Last period was normal.   Patient's last menstrual period was 11/05/2018. The following portions of the patient's history were reviewed and updated as appropriate: allergies, current medications, past family history, past medical history, past social history, past surgical history and problem list.  Review of Systems Pertinent items are noted in HPI.     Objective:    BP 124/74   Pulse 82   Wt 190 lb 6 oz (86.4 kg)   LMP 11/05/2018   BMI 34.82 kg/m  General: alert, cooperative, appears stated age and no acute distress    Lab Review Urine HCG: positive    Assessment:    Absence of menstruation.     Plan:  Positive: EDC: 08/12/19. Briefly discussed pre-natal care options. Pt thought she needed to delivery at [facility] for VBAC given that she was not able to deliver at [facility] with her last baby. Informed pt that [facility] now has anesthesia staff to allow for VBAC.  Encouraged well-balanced diet, plenty of rest when needed, pre-natal vitamins daily and walking for exercise. Discussed self-help for nausea, avoiding OTC medications until consulting provider or pharmacist, other than Tylenol as needed, minimal caffeine (1-2 cups daily) and avoiding alcohol. Information given on genetic screening test. She will schedule her initial nurse OB visit @ 12 wks and her NOB physical exam @ 12 wks. Order placed for [facility].   Doreene Burke, CNM

## 2019-01-07 ENCOUNTER — Telehealth: Payer: Self-pay | Admitting: Certified Nurse Midwife

## 2019-01-07 NOTE — Telephone Encounter (Signed)
The patient suspects that she has a possible UTI and would like to know what to do next. Please advise.

## 2019-01-08 ENCOUNTER — Encounter: Payer: Self-pay | Admitting: Obstetrics and Gynecology

## 2019-01-10 ENCOUNTER — Telehealth: Payer: Self-pay

## 2019-01-10 ENCOUNTER — Encounter: Payer: Self-pay | Admitting: Certified Nurse Midwife

## 2019-01-10 NOTE — Telephone Encounter (Signed)
Several mychart messages sent to patient with no reply.

## 2019-01-10 NOTE — Patient Instructions (Signed)
WHAT OB PATIENTS CAN EXPECT   Confirmation of pregnancy and ultrasound ordered if medically indicated-[redacted] weeks gestation  New OB (NOB) intake with nurse and New OB (NOB) labs- [redacted] weeks gestation  New OB (NOB) physical examination with provider- 11/[redacted] weeks gestation  Flu vaccine-[redacted] weeks gestation  Anatomy scan-[redacted] weeks gestation  Glucose tolerance test, blood work to test for anemia, T-dap vaccine-[redacted] weeks gestation  Vaginal swabs/cultures-STD/Group B strep-[redacted] weeks gestation  Appointments every 4 weeks until 28 weeks  Every 2 weeks from 28 weeks until 36 weeks  Weekly visits from 36 weeks until delivery  Morning Sickness  Morning sickness is when you feel sick to your stomach (nauseous) during pregnancy. You may feel sick to your stomach and throw up (vomit). You may feel sick in the morning, but you can feel this way at any time of day. Some women feel very sick to their stomach and cannot stop throwing up (hyperemesis gravidarum). Follow these instructions at home: Medicines  Take over-the-counter and prescription medicines only as told by your doctor. Do not take any medicines until you talk with your doctor about them first.  Taking multivitamins before getting pregnant can stop or lessen the harshness of morning sickness. Eating and drinking  Eat dry toast or crackers before getting out of bed.  Eat 5 or 6 small meals a day.  Eat dry and bland foods like rice and baked potatoes.  Do not eat greasy, fatty, or spicy foods.  Have someone cook for you if the smell of food causes you to feel sick or throw up.  If you feel sick to your stomach after taking prenatal vitamins, take them at night or with a snack.  Eat protein when you need a snack. Nuts, yogurt, and cheese are good choices.  Drink fluids throughout the day.  Try ginger ale made with real ginger, ginger tea made from fresh grated ginger, or ginger candies. General instructions  Do not use any products  that have nicotine or tobacco in them, such as cigarettes and e-cigarettes. If you need help quitting, ask your doctor.  Use an air purifier to keep the air in your house free of smells.  Get lots of fresh air.  Try to avoid smells that make you feel sick.  Try: ? Wearing a bracelet that is used for seasickness (acupressure wristband). ? Going to a doctor who puts thin needles into certain body points (acupuncture) to improve how you feel. Contact a doctor if:  You need medicine to feel better.  You feel dizzy or light-headed.  You are losing weight. Get help right away if:  You feel very sick to your stomach and cannot stop throwing up.  You pass out (faint).  You have very bad pain in your belly. Summary  Morning sickness is when you feel sick to your stomach (nauseous) during pregnancy.  You may feel sick in the morning, but you can feel this way at any time of day.  Making some changes to what you eat may help your symptoms go away. This information is not intended to replace advice given to you by your health care provider. Make sure you discuss any questions you have with your health care provider. Document Released: 12/21/2004 Document Revised: 12/14/2016 Document Reviewed: 12/14/2016 Elsevier Interactive Patient Education  2019 [employer]. How a Baby Grows During Pregnancy  Pregnancy begins when a female's sperm enters a female's egg (fertilization). Fertilization usually happens in one of the tubes (fallopian tubes) that connect  the ovaries to the womb (uterus). The fertilized egg moves down the fallopian tube to the uterus. Once it reaches the uterus, it implants into the lining of the uterus and begins to grow. For the first 10 weeks, the fertilized egg is called an embryo. After 10 weeks, it is called a fetus. As the fetus continues to grow, it receives oxygen and nutrients through tissue (placenta) that grows to support the developing baby. The placenta is the life  support system for the baby. It provides oxygen and nutrition and removes waste. Learning as much as you can about your pregnancy and how your baby is developing can help you enjoy the experience. It can also make you aware of when there might be a problem and when to ask questions. How long does a typical pregnancy last? A pregnancy usually lasts 280 days, or about 40 weeks. Pregnancy is divided into three periods of growth, also called trimesters:  First trimester: 0-12 weeks.  Second trimester: 13-27 weeks.  Third trimester: 28-40 weeks. The day when your baby is ready to be born (full term) is your estimated date of delivery. How does my baby develop month by month? First month  The fertilized egg attaches to the inside of the uterus.  Some cells will form the placenta. Others will form the fetus.  The arms, legs, brain, spinal cord, lungs, and heart begin to develop.  At the end of the first month, the heart begins to beat. Second month  The bones, inner ear, eyelids, hands, and feet form.  The genitals develop.  By the end of 8 weeks, all major organs are developing. Third month  All of the internal organs are forming.  Teeth develop below the gums.  Bones and muscles begin to grow. The spine can flex.  The skin is transparent.  Fingernails and toenails begin to form.  Arms and legs continue to grow longer, and hands and feet develop.  The fetus is about 3 inches (7.6 cm) long. Fourth month  The placenta is completely formed.  The external sex organs, neck, outer ear, eyebrows, eyelids, and fingernails are formed.  The fetus can hear, swallow, and move its arms and legs.  The kidneys begin to produce urine.  The skin is covered with a white, waxy coating (vernix) and very fine hair (lanugo). Fifth month  The fetus moves around more and can be felt for the first time (quickening).  The fetus starts to sleep and wake up and may begin to suck its  finger.  The nails grow to the end of the fingers.  The organ in the digestive system that makes bile (gallbladder) functions and helps to digest nutrients.  If your baby is a girl, eggs are present in her ovaries. If your baby is a boy, testicles start to move down into his scrotum. Sixth month  The lungs are formed.  The eyes open. The brain continues to develop.  Your baby has fingerprints and toe prints. Your baby's hair grows thicker.  At the end of the second trimester, the fetus is about 9 inches (22.9 cm) long. Seventh month  The fetus kicks and stretches.  The eyes are developed enough to sense changes in light.  The hands can make a grasping motion.  The fetus responds to sound. Eighth month  All organs and body systems are fully developed and functioning.  Bones harden, and taste buds develop. The fetus may hiccup.  Certain areas of the brain are still developing.  The skull remains soft. Ninth month  The fetus gains about  lb (0.23 kg) each week.  The lungs are fully developed.  Patterns of sleep develop.  The fetus's head typically moves into a head-down position (vertex) in the uterus to prepare for birth.  The fetus weighs 6-9 lb (2.72-4.08 kg) and is 19-20 inches (48.26-50.8 cm) long. What can I do to have a healthy pregnancy and help my baby develop? General instructions  Take prenatal vitamins as directed by your health care provider. These include vitamins such as folic acid, iron, calcium, and vitamin D. They are important for healthy development.  Take medicines only as directed by your health care provider. Read labels and ask a pharmacist or your health care provider whether over-the-counter medicines, supplements, and prescription drugs are safe to take during pregnancy.  Keep all follow-up visits as directed by your health care provider. This is important. Follow-up visits include prenatal care and screening tests. How do I know if my baby  is developing well? At each prenatal visit, your health care provider will do several different tests to check on your health and keep track of your baby's development. These include:  Fundal height and position. ? Your health care provider will measure your growing belly from your pubic bone to the top of the uterus using a tape measure. ? Your health care provider will also feel your belly to determine your baby's position.  Heartbeat. ? An ultrasound in the first trimester can confirm pregnancy and show a heartbeat, depending on how far along you are. ? Your health care provider will check your baby's heart rate at every prenatal visit.  Second trimester ultrasound. ? This ultrasound checks your baby's development. It also may show your baby's gender. What should I do if I have concerns about my baby's development? Always talk with your health care provider about any concerns that you may have about your pregnancy and your baby. Summary  A pregnancy usually lasts 280 days, or about 40 weeks. Pregnancy is divided into three periods of growth, also called trimesters.  Your health care provider will monitor your baby's growth and development throughout your pregnancy.  Follow your health care provider's recommendations about taking prenatal vitamins and medicines during your pregnancy.  Talk with your health care provider if you have any concerns about your pregnancy or your developing baby. This information is not intended to replace advice given to you by your health care provider. Make sure you discuss any questions you have with your health care provider. Document Released: 05/01/2008 Document Revised: 09/26/2017 Document Reviewed: 09/26/2017 Elsevier Interactive Patient Education  2019 [employer] Pregnancy  The first trimester of pregnancy is from week 1 until the end of week 13 (months 1 through 3). During this time, your baby will begin to develop inside  you. At 6-8 weeks, the eyes and face are formed, and the heartbeat can be seen on ultrasound. At the end of 12 weeks, all the baby's organs are formed. Prenatal care is all the medical care you receive before the birth of your baby. Make sure you get good prenatal care and follow all of your doctor's instructions. Follow these instructions at home: Medicines  Take over-the-counter and prescription medicines only as told by your doctor. Some medicines are safe and some medicines are not safe during pregnancy.  Take a prenatal vitamin that contains at least 600 micrograms (mcg) of folic acid.  If you have trouble pooping (constipation), take  medicine that will make your stool soft (stool softener) if your doctor approves. Eating and drinking   Eat regular, healthy meals.  Your doctor will tell you the amount of weight gain that is right for you.  Avoid raw meat and uncooked cheese.  If you feel sick to your stomach (nauseous) or throw up (vomit): ? Eat 4 or 5 small meals a day instead of 3 large meals. ? Try eating a few soda crackers. ? Drink liquids between meals instead of during meals.  To prevent constipation: ? Eat foods that are high in fiber, like fresh fruits and vegetables, whole grains, and beans. ? Drink enough fluids to keep your pee (urine) clear or pale yellow. Activity  Exercise only as told by your doctor. Stop exercising if you have cramps or pain in your lower belly (abdomen) or low back.  Do not exercise if it is too hot, too humid, or if you are in a place of great height (high altitude).  Try to avoid standing for long periods of time. Move your legs often if you must stand in one place for a long time.  Avoid heavy lifting.  Wear low-heeled shoes. Sit and stand up straight.  You can have sex unless your doctor tells you not to. Relieving pain and discomfort  Wear a good support bra if your breasts are sore.  Take warm water baths (sitz baths) to soothe  pain or discomfort caused by hemorrhoids. Use hemorrhoid cream if your doctor says it is okay.  Rest with your legs raised if you have leg cramps or low back pain.  If you have puffy, bulging veins (varicose veins) in your legs: ? Wear support hose or compression stockings as told by your doctor. ? Raise (elevate) your feet for 15 minutes, 3-4 times a day. ? Limit salt in your food. Prenatal care  Schedule your prenatal visits by the twelfth week of pregnancy.  Write down your questions. Take them to your prenatal visits.  Keep all your prenatal visits as told by your doctor. This is important. Safety  Wear your seat belt at all times when driving.  Make a list of emergency phone numbers. The list should include numbers for family, friends, the hospital, and police and fire departments. General instructions  Ask your doctor for a referral to a local prenatal class. Begin classes no later than at the start of month 6 of your pregnancy.  Ask for help if you need counseling or if you need help with nutrition. Your doctor can give you advice or tell you where to go for help.  Do not use hot tubs, steam rooms, or saunas.  Do not douche or use tampons or scented sanitary pads.  Do not cross your legs for long periods of time.  Avoid all herbs and alcohol. Avoid drugs that are not approved by your doctor.  Do not use any tobacco products, including cigarettes, chewing tobacco, and electronic cigarettes. If you need help quitting, ask your doctor. You may get counseling or other support to help you quit.  Avoid cat litter boxes and soil used by cats. These carry germs that can cause birth defects in the baby and can cause a loss of your baby (miscarriage) or stillbirth.  Visit your dentist. At home, brush your teeth with a soft toothbrush. Be gentle when you floss. Contact a doctor if:  You are dizzy.  You have mild cramps or pressure in your lower belly.  You have a  nagging pain  in your belly area.  You continue to feel sick to your stomach, you throw up, or you have watery poop (diarrhea).  You have a bad smelling fluid coming from your vagina.  You have pain when you pee (urinate).  You have increased puffiness (swelling) in your face, hands, legs, or ankles. Get help right away if:  You have a fever.  You are leaking fluid from your vagina.  You have spotting or bleeding from your vagina.  You have very bad belly cramping or pain.  You gain or lose weight rapidly.  You throw up blood. It may look like coffee grounds.  You are around people who have German measles, fifth disease, or chickenpox.  You have a very bad headache.  You have shortness of breath.  You have any kind of trauma, such as from a fall or a car accident. Summary  The first trimester of pregnancy is from week 1 until the end of week 13 (months 1 through 3).  To take care of yourself and your unborn baby, you will need to eat healthy meals, take medicines only if your doctor tells you to do so, and do activities that are safe for you and your baby.  Keep all follow-up visits as told by your doctor. This is important as your doctor will have to ensure that your baby is healthy and growing well. This information is not intended to replace advice given to you by your health care provider. Make sure you discuss any questions you have with your health care provider. Document Released: 05/01/2008 Document Revised: 11/21/2016 Document Reviewed: 11/21/2016 Elsevier Interactive Patient Education  2019 [employer] Commonly Asked Questions During Pregnancy  Cats: A parasite can be excreted in cat feces.  To avoid exposure you need to have another person empty the little box.  If you must empty the litter box you will need to wear gloves.  Wash your hands after handling your cat.  This parasite can also be found in raw or undercooked meat so this should also be avoided.  Colds, Sore  Throats, Flu: Please check your medication sheet to see what you can take for symptoms.  If your symptoms are unrelieved by these medications please call the office.  Dental Work: Most any dental work your dentist recommends is permitted.  X-rays should only be taken during the first trimester if absolutely necessary.  Your abdomen should be shielded with a lead apron during all x-rays.  Please notify your provider prior to receiving any x-rays.  Novocaine is fine; gas is not recommended.  If your dentist requires a note from us prior to dental work please call the office and we will provide one for you.  Exercise: Exercise is an important part of staying healthy during your pregnancy.  You may continue most exercises you were accustomed to prior to pregnancy.  Later in your pregnancy you will most likely notice you have difficulty with activities requiring balance like riding a bicycle.  It is important that you listen to your body and avoid activities that put you at a higher risk of falling.  Adequate rest and staying well hydrated are a must!  If you have questions about the safety of specific activities ask your provider.    Exposure to Children with illness: Try to avoid obvious exposure; report any symptoms to us when noted,  If you have chicken pos, red measles or mumps, you should be immune to these diseases.     Please do not take any vaccines while pregnant unless you have checked with your OB provider.  Fetal Movement: After 28 weeks we recommend you do "kick counts" twice daily.  Lie or sit down in a calm quiet environment and count your baby movements "kicks".  You should feel your baby at least 10 times per hour.  If you have not felt 10 kicks within the first hour get up, walk around and have something sweet to eat or drink then repeat for an additional hour.  If count remains less than 10 per hour notify your provider.  Fumigating: Follow your pest control agent's advice as to how long to  stay out of your home.  Ventilate the area well before re-entering.  Hemorrhoids:   Most over-the-counter preparations can be used during pregnancy.  Check your medication to see what is safe to use.  It is important to use a stool softener or fiber in your diet and to drink lots of liquids.  If hemorrhoids seem to be getting worse please call the office.   Hot Tubs:  Hot tubs Jacuzzis and saunas are not recommended while pregnant.  These increase your internal body temperature and should be avoided.  Intercourse:  Sexual intercourse is safe during pregnancy as long as you are comfortable, unless otherwise advised by your provider.  Spotting may occur after intercourse; report any bright red bleeding that is heavier than spotting.  Labor:  If you know that you are in labor, please go to the hospital.  If you are unsure, please call the office and let us help you decide what to do.  Lifting, straining, etc:  If your job requires heavy lifting or straining please check with your provider for any limitations.  Generally, you should not lift items heavier than that you can lift simply with your hands and arms (no back muscles)  Painting:  Paint fumes do not harm your pregnancy, but may make you ill and should be avoided if possible.  Latex or water based paints have less odor than oils.  Use adequate ventilation while painting.  Permanents & Hair Color:  Chemicals in hair dyes are not recommended as they cause increase hair dryness which can increase hair loss during pregnancy.  " Highlighting" and permanents are allowed.  Dye may be absorbed differently and permanents may not hold as well during pregnancy.  Sunbathing:  Use a sunscreen, as skin burns easily during pregnancy.  Drink plenty of fluids; avoid over heating.  Tanning Beds:  Because their possible side effects are still unknown, tanning beds are not recommended.  Ultrasound Scans:  Routine ultrasounds are performed at approximately 20  weeks.  You will be able to see your baby's general anatomy an if you would like to know the gender this can usually be determined as well.  If it is questionable when you conceived you may also receive an ultrasound early in your pregnancy for dating purposes.  Otherwise ultrasound exams are not routinely performed unless there is a medical necessity.  Although you can request a scan we ask that you pay for it when conducted because insurance does not cover " patient request" scans.  Work: If your pregnancy proceeds without complications you may work until your due date, unless your physician or employer advises otherwise.  Round Ligament Pain/Pelvic Discomfort:  Sharp, shooting pains not associated with bleeding are fairly common, usually occurring in the second trimester of pregnancy.  They tend to be worse when standing up or  when you remain standing for long periods of time.  These are the result of pressure of certain pelvic ligaments called "round ligaments".  Rest, Tylenol and heat seem to be the most effective relief.  As the womb and fetus grow, they rise out of the pelvis and the discomfort improves.  Please notify the office if your pain seems different than that described.  It may represent a more serious condition.  Common Medications Safe in Pregnancy  Acne:      Constipation:  Benzoyl Peroxide     Colace  Clindamycin      Dulcolax Suppository  Topica Erythromycin     Fibercon  Salicylic Acid      Metamucil         Miralax AVOID:        Senakot   Accutane    Cough:  Retin-A       Cough Drops  Tetracycline      Phenergan w/ Codeine if Rx  Minocycline      Robitussin (Plain & DM)  Antibiotics:     Crabs/Lice:  Ceclor       RID  Cephalosporins    AVOID:  E-Mycins      Kwell  Keflex  Macrobid/Macrodantin   Diarrhea:  Penicillin      Kao-Pectate  Zithromax      Imodium AD         PUSH FLUIDS AVOID:       Cipro     Fever:  Tetracycline      Tylenol (Regular or  Extra  Minocycline       Strength)  Levaquin      Extra Strength-Do not          Exceed 8 tabs/24 hrs Caffeine:        <224m/day (equiv. To 1 cup of coffee or  approx. 3 12 oz sodas)         Gas: Cold/Hayfever:       Gas-X  Benadryl      Mylicon  Claritin       Phazyme  **Claritin-D        Chlor-Trimeton    Headaches:  Dimetapp      ASA-Free Excedrin  Drixoral-Non-Drowsy     Cold Compress  Mucinex (Guaifenasin)     Tylenol (Regular or Extra  Sudafed/Sudafed-12 Hour     Strength)  **Sudafed PE Pseudoephedrine   Tylenol Cold & Sinus     Vicks Vapor Rub  Zyrtec  **AVOID if Problems With Blood Pressure         Heartburn: Avoid lying down for at least 1 hour after meals  Aciphex      Maalox     Rash:  Milk of Magnesia     Benadryl    Mylanta       1% Hydrocortisone Cream  Pepcid  Pepcid Complete   Sleep Aids:  Prevacid      Ambien   Prilosec       Benadryl  Rolaids       Chamomile Tea  Tums (Limit 4/day)     Unisom  Zantac       Tylenol PM         Warm milk-add vanilla or  Hemorrhoids:       Sugar for taste  Anusol/Anusol H.C.  (RX: Analapram 2.5%)  Sugar Substitutes:  Hydrocortisone OTC     Ok in moderation  Preparation H      Tucks  Vaseline lotion applied to tissue with wiping    Herpes:     Throat:  Acyclovir      Oragel  Famvir  Valtrex     Vaccines:         Flu Shot Leg Cramps:       *Gardasil  Benadryl      Hepatitis A         Hepatitis B Nasal Spray:       Pneumovax  Saline Nasal Spray     Polio Booster         Tetanus Nausea:       Tuberculosis test or PPD  Vitamin B6 25 mg TID   AVOID:    Dramamine      *Gardasil  Emetrol       Live Poliovirus  Ginger Root 250 mg QID    MMR (measles, mumps &  High Complex Carbs @ Bedtime    rebella)  Sea Bands-Accupressure    Varicella (Chickenpox)  Unisom 1/2 tab TID     *No known complications           If received before Pain:         Known pregnancy;   Darvocet       Resume series  after  Lortab        Delivery  Percocet    Yeast:   Tramadol      Femstat  Tylenol 3      Gyne-lotrimin  Ultram       Monistat  Vicodin           MISC:         All Sunscreens           Hair Coloring/highlights          Insect Repellant's          (Including DEET)         Mystic Tans

## 2019-01-10 NOTE — Progress Notes (Signed)
      Pamela Schmidt presents for NOB nurse intake visit. Pregnancy confirmation done at  [facility].  G 5  P3 0 1 3  LMP 11/05/18 . Pregnancy education material explained and given.   0 cats in the home.  NOB labs ordered. BMI greater than 30. TSH/HbgA1c. HIV and drug screen explained and ordered. Genetic screening discussed. Genetic testing;Unsure. Pt to discuss genetic testing with provider. PNV encouraged. Pt to follow up with provider in 2.weeks for NOB physical. [facility] Financial Policy reviewed and initialed. FLMA form signed. Pt stated that she understood.

## 2019-01-14 ENCOUNTER — Ambulatory Visit (INDEPENDENT_AMBULATORY_CARE_PROVIDER_SITE_OTHER): Payer: Medicaid Other

## 2019-01-14 ENCOUNTER — Encounter: Payer: Self-pay | Admitting: Certified Nurse Midwife

## 2019-01-14 ENCOUNTER — Ambulatory Visit: Payer: Medicaid Other | Admitting: Certified Nurse Midwife

## 2019-01-14 VITALS — BP 126/72 | HR 76 | Ht 62.0 in | Wt 193.2 lb

## 2019-01-14 DIAGNOSIS — Z3687 Encounter for antenatal screening for uncertain dates: Secondary | ICD-10-CM | POA: Diagnosis not present

## 2019-01-14 DIAGNOSIS — Z0283 Encounter for blood-alcohol and blood-drug test: Secondary | ICD-10-CM | POA: Diagnosis not present

## 2019-01-14 DIAGNOSIS — Z113 Encounter for screening for infections with a predominantly sexual mode of transmission: Secondary | ICD-10-CM

## 2019-01-14 DIAGNOSIS — Z3481 Encounter for supervision of other normal pregnancy, first trimester: Secondary | ICD-10-CM | POA: Diagnosis not present

## 2019-01-14 DIAGNOSIS — O09521 Supervision of elderly multigravida, first trimester: Secondary | ICD-10-CM | POA: Diagnosis not present

## 2019-01-14 DIAGNOSIS — Z3A08 8 weeks gestation of pregnancy: Secondary | ICD-10-CM

## 2019-01-14 DIAGNOSIS — R638 Other symptoms and signs concerning food and fluid intake: Secondary | ICD-10-CM

## 2019-01-15 LAB — HEPATITIS B SURFACE ANTIGEN: HEP B S AG: NEGATIVE

## 2019-01-15 LAB — ABO AND RH: RH TYPE: POSITIVE

## 2019-01-15 LAB — CBC
Hematocrit: 38.6 % (ref 34.0–46.6)
Hemoglobin: 12.9 g/dL (ref 11.1–15.9)
MCH: 29.4 pg (ref 26.6–33.0)
MCHC: 33.4 g/dL (ref 31.5–35.7)
MCV: 88 fL (ref 79–97)
Platelets: 241 10*3/uL (ref 150–450)
RBC: 4.39 x10E6/uL (ref 3.77–5.28)
RDW: 11.9 % (ref 11.7–15.4)
WBC: 7.6 10*3/uL (ref 3.4–10.8)

## 2019-01-15 LAB — URINALYSIS, ROUTINE W REFLEX MICROSCOPIC
Bilirubin, UA: NEGATIVE
Glucose, UA: NEGATIVE
Ketones, UA: NEGATIVE
Leukocytes, UA: NEGATIVE
Nitrite, UA: NEGATIVE
Protein, UA: NEGATIVE
RBC, UA: NEGATIVE
Specific Gravity, UA: 1.028 (ref 1.005–1.030)
Urobilinogen, Ur: 0.2 mg/dL (ref 0.2–1.0)
pH, UA: 6 (ref 5.0–7.5)

## 2019-01-15 LAB — DRUG PROFILE, UR, 9 DRUGS (LABCORP)
AMPHETAMINES, URINE: NEGATIVE ng/mL
Barbiturate Quant, Ur: NEGATIVE ng/mL
Benzodiazepine Quant, Ur: NEGATIVE ng/mL
Cannabinoid Quant, Ur: NEGATIVE ng/mL
Cocaine (Metab.): NEGATIVE ng/mL
Methadone Screen, Urine: NEGATIVE ng/mL
Opiate Quant, Ur: NEGATIVE ng/mL
PCP Quant, Ur: NEGATIVE ng/mL
Propoxyphene: NEGATIVE ng/mL

## 2019-01-15 LAB — HEMOGLOBIN A1C
Est. average glucose Bld gHb Est-mCnc: 91 mg/dL
Hgb A1c MFr Bld: 4.8 % (ref 4.8–5.6)

## 2019-01-15 LAB — RUBELLA SCREEN: Rubella Antibodies, IGG: 14 index (ref >0.99)

## 2019-01-15 LAB — ANTIBODY SCREEN: Antibody Screen: NEGATIVE

## 2019-01-15 LAB — VARICELLA ZOSTER ANTIBODY, IGG: Varicella zoster IgG: 1686 index (ref >165)

## 2019-01-15 LAB — RPR: RPR Ser Ql: NONREACTIVE

## 2019-01-15 LAB — NICOTINE SCREEN, URINE: COTININE UR QL SCN: NEGATIVE ng/mL

## 2019-01-15 LAB — HIV ANTIBODY (ROUTINE TESTING W REFLEX): HIV Screen 4th Generation wRfx: NONREACTIVE

## 2019-01-15 LAB — TSH: TSH: 3.64 u[IU]/mL (ref 0.450–4.500)

## 2019-01-16 LAB — GC/CHLAMYDIA PROBE AMP
Chlamydia trachomatis, NAA: NEGATIVE
Neisseria gonorrhoeae by PCR: NEGATIVE

## 2019-01-16 LAB — URINE CULTURE, OB REFLEX

## 2019-01-16 LAB — CULTURE, OB URINE

## 2019-01-31 ENCOUNTER — Encounter: Payer: Self-pay | Admitting: Certified Nurse Midwife

## 2019-01-31 ENCOUNTER — Ambulatory Visit (INDEPENDENT_AMBULATORY_CARE_PROVIDER_SITE_OTHER): Payer: Medicaid Other | Admitting: Certified Nurse Midwife

## 2019-01-31 ENCOUNTER — Other Ambulatory Visit: Payer: Self-pay | Admitting: Certified Nurse Midwife

## 2019-01-31 ENCOUNTER — Encounter: Payer: Medicaid Other | Admitting: Certified Nurse Midwife

## 2019-01-31 ENCOUNTER — Other Ambulatory Visit (HOSPITAL_COMMUNITY)
Admission: RE | Admit: 2019-01-31 | Discharge: 2019-01-31 | Disposition: A | Discharge status: Home / Self Care | Payer: Medicaid Other | Source: Ambulatory Visit | Attending: Certified Nurse Midwife | Admitting: Certified Nurse Midwife

## 2019-01-31 VITALS — BP 116/69 | HR 88 | Wt 200.2 lb

## 2019-01-31 DIAGNOSIS — Z3A12 12 weeks gestation of pregnancy: Secondary | ICD-10-CM

## 2019-01-31 DIAGNOSIS — Z3481 Encounter for supervision of other normal pregnancy, first trimester: Secondary | ICD-10-CM | POA: Diagnosis not present

## 2019-01-31 DIAGNOSIS — Z8619 Personal history of other infectious and parasitic diseases: Secondary | ICD-10-CM | POA: Diagnosis not present

## 2019-01-31 LAB — POCT URINALYSIS DIPSTICK OB
BILIRUBIN UA: NEGATIVE
Blood, UA: NEGATIVE
GLUCOSE, UA: NEGATIVE
Leukocytes, UA: NEGATIVE
Nitrite, UA: NEGATIVE
POC,PROTEIN,UA: NEGATIVE
Spec Grav, UA: >=1.03 — AB (ref 1.010–1.025)
Urobilinogen, UA: 0.2 E.U./dL
pH, UA: 6 (ref 5.0–8.0)

## 2019-01-31 MED ORDER — DOXYLAMINE-PYRIDOXINE 10-10 MG PO TBEC: 2.0000 | DELAYED_RELEASE_TABLET | Freq: Every day | ORAL | 5 refills | Status: DC

## 2019-01-31 NOTE — Progress Notes (Signed)
NEW OB HISTORY AND PHYSICAL  SUBJECTIVE:       Pamela Schmidt is a 33 y.o. [phone] female, Patient's last menstrual period was 11/05/2018., Estimated Date of Delivery: 08/12/19, [redacted]w[redacted]d, presents today for establishment of Prenatal Care. She has no unusual complaints.      Gynecologic History Patient's last menstrual period was 11/05/2018. Normal Contraception: none Last Pap: a few yrs ago . Results were: normal  Obstetric History OB History  Gravida Para Term Preterm AB Living  5 3 3   1 3   SAB TAB Ectopic Multiple Live Births  1       3    # Outcome Date GA Lbr Len/2nd Weight Sex Delivery Anes PTL Lv  5 Current           4 Term 2015   8 lb 6 oz (3.799 kg) M Vag-Spont  N LIV  3 SAB 2011          2 Term 2010   6 lb (2.722 kg) F CS-Unspec  N LIV  1 Term 2005   7 lb 2 oz (3.232 kg) M CS-Unspec  N LIV    Past Medical History:  Diagnosis Date  . Chicken pox     Past Surgical History:  Procedure Laterality Date  . CESAREAN SECTION  2005, 2010  . TONSILLECTOMY      Current Outpatient Medications on File Prior to Visit  Medication Sig Dispense Refill  . prenatal vitamin w/FE, FA (PRENATAL 1 + 1) 27-1 MG TABS tablet Take 1 tablet by mouth daily at 12 noon.     Current Facility-Administered Medications on File Prior to Visit  Medication Dose Route Frequency Provider Last Rate Last Dose  . betamethasone acetate-betamethasone sodium phosphate (CELESTONE) injection 12 mg  12 mg Intramuscular Once Gala Lewandowsky M, DPM      . triamcinolone acetonide (KENALOG) 10 MG/ML injection 10 mg  10 mg Other Once [name], DPM        Allergies  Allergen Reactions  . Penicillins Itching and Rash    Did it involve swelling of the face/tongue/throat, SOB, or low BP? No Did it involve sudden or severe rash/hives, skin peeling, or any reaction on the inside of your mouth or nose? Yes Did you need to seek medical attention at a hospital or doctor's office? Unknown When did it last  happen?Unknown If all above answers are "NO", may proceed with cephalosporin use.     Social History   Socioeconomic History  . Marital status: Married    Spouse name: Not on file  . Number of children: Not on file  . Years of education: 79  . Highest education level: Not on file  Occupational History  . 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 Financial resource strain: Not on file  . Food insecurity:    Worry: Not on file    Inability: Not on file  . Transportation needs:    Medical: Not on file    Non-medical: Not on file  Tobacco Use  . Smoking status: Former Smoker    Last attempt to quit: 01/30/2010    Years since quitting: 9.0  . Smokeless tobacco: Never Used  Substance and Sexual Activity  . Alcohol use: Not Currently    Alcohol/week: 3.0 - 4.0 standard drinks    Types: 3 - 4 Glasses of wine per week  . Drug use: No  .  Sexual activity: Yes    Partners: Male    Birth control/protection: None  Lifestyle  . Physical activity:    Days per week: Not on file    Minutes per session: Not on file  . Stress: Not on file  Relationships  . Social connections:    Talks on phone: Not on file    Gets together: Not on file    Attends religious service: Not on file    Active member of club or organization: Not on file    Attends meetings of clubs or organizations: Not on file    Relationship status: Not on file  . Intimate partner violence:    Fear of current or ex partner: Not on file    Emotionally abused: Not on file    Physically abused: Not on file    Forced sexual activity: Not on file  Other Topics Concern  . Not on file  Social History Narrative   Eliana was born in [place] and then moved to [place] area when she was in middle school. She lives at home with her husband and children. She works as a Sales executive. She enjoys working outside in the yard and shopping.    Family History   Problem Relation Age of Onset  . Hypertension Mother   . Diverticulitis Mother   . Diabetes Father   . Diverticulitis Maternal Aunt   . Diverticulitis Maternal Grandmother   . Bipolar disorder Maternal Aunt     The following portions of the patient's history were reviewed and updated as appropriate: allergies, current medications, past OB history, past medical history, past surgical history, past family history, past social history, and problem list.  Body mass index is 36.61 kg/m.   OBJECTIVE: Initial Physical Exam (New OB)  GENERAL APPEARANCE: alert, well appearing, in no apparent distress, oriented to person, place and time, overweight HEAD: normocephalic, atraumatic MOUTH: mucous membranes moist, pharynx normal without lesions THYROID: no thyromegaly or masses present BREASTS: no masses noted, no significant tenderness, no palpable axillary nodes, no skin changes LUNGS: clear to auscultation, no wheezes, rales or rhonchi, symmetric air entry HEART: regular rate and rhythm, no murmurs ABDOMEN: soft, nontender, nondistended, no abnormal masses, no epigastric pain EXTREMITIES: no redness or tenderness in the calves or thighs SKIN: normal coloration and turgor, no rashes LYMPH NODES: no adenopathy palpable NEUROLOGIC: alert, oriented, normal speech, no focal findings or movement disorder noted  PELVIC EXAM EXTERNAL GENITALIA: normal appearing vulva with no masses, tenderness or lesions VAGINA: no abnormal discharge or lesions CERVIX: no lesions or cervical motion tenderness UTERUS: gravid ADNEXA: no masses palpable and nontender OB EXAM PELVIMETRY: appears adequate RECTUM: exam not indicated   Unable to get u/s with doppler. Unofficial u/s able to visualize heart tones.   ASSESSMENT: Normal pregnancy   PLAN: New OB counseling: The patient has been given an overview regarding routine prenatal care. Recommendations regarding diet, weight gain, and exercise in  pregnancy were given. Prenatal testing, optional genetic testing,carrier screening and ultrasound use in pregnancy were reviewed. Panorama test completed. Benefits of Breast Feeding were discussed. The patient is encouraged to consider nursing her baby post partum.  Doreene Burke, CNM

## 2019-01-31 NOTE — Patient Instructions (Signed)

## 2019-02-06 LAB — CYTOLOGY - PAP
Diagnosis: NEGATIVE
HPV: NOT DETECTED

## 2019-02-27 ENCOUNTER — Telehealth: Payer: Self-pay

## 2019-02-27 NOTE — Telephone Encounter (Signed)
Coronavirus (COVID-19) Are you at risk?  Are you at risk for the Coronavirus (COVID-19)?  To be considered HIGH RISK for Coronavirus (COVID-19), you have to meet the following criteria:  . Traveled to [place], [place], [place], [place] or [place]; or in the [place] to [place], [place], [place], or [place]; and have fever, cough, and shortness of breath within the last 2 weeks of travel OR . Been in close contact with a person diagnosed with COVID-19 within the last 2 weeks and have fever, cough, and shortness of breath . IF YOU DO NOT MEET THESE CRITERIA, YOU ARE CONSIDERED LOW RISK FOR COVID-19.  What to do if you are HIGH RISK for COVID-19?  . If you are having a medical emergency, call 911. . Seek medical care right away. Before you go to a doctor's office, urgent care or emergency department, call ahead and tell them about your recent travel, contact with someone diagnosed with COVID-19, and your symptoms. You should receive instructions from your physician's office regarding next steps of care.  . When you arrive at healthcare provider, tell the healthcare staff immediately you have returned from visiting [place], [place], [place], [place] or [place]; or traveled in the [place] to [place], [place], [place], or [place]; in the last two weeks or you have been in close contact with a person diagnosed with COVID-19 in the last 2 weeks.   . Tell the health care staff about your symptoms: fever, cough and shortness of breath. . After you have been seen by a medical provider, you will be either: o Tested for (COVID-19) and discharged home on quarantine except to seek medical care if symptoms worsen, and asked to  - Stay home and avoid contact with others until you get your results (4-5 days)  - Avoid travel on public transportation if possible (such as bus, train, or airplane) or o Sent to the Emergency Department by EMS for evaluation, COVID-19 testing, and possible  admission depending on your condition and test results.  What to do if you are LOW RISK for COVID-19?  Reduce your risk of any infection by using the same precautions used for avoiding the common cold or flu:  . Wash your hands often with soap and warm water for at least 20 seconds.  If soap and water are not readily available, use an alcohol-based hand sanitizer with at least 60% alcohol.  . If coughing or sneezing, cover your mouth and nose by coughing or sneezing into the elbow areas of your shirt or coat, into a tissue or into your sleeve (not your hands). . Avoid shaking hands with others and consider head nods or verbal greetings only. . Avoid touching your eyes, nose, or mouth with unwashed hands.  . Avoid close contact with people who are sick. . Avoid places or events with large numbers of people in one location, like concerts or sporting events. . Carefully consider travel plans you have or are making. . If you are planning any travel outside or inside the [place], visit the CDC's Travelers' Health webpage for the latest health notices. . If you have some symptoms but not all symptoms, continue to monitor at home and seek medical attention if your symptoms worsen. . If you are having a medical emergency, call 911.   ADDITIONAL HEALTHCARE OPTIONS FOR PATIENTS  [facility] / e-Visit: [email or web address]         [facility]: [phone]  [facility]: [phone]                   [facility]: [phone]   Prescreened- neg. cm  

## 2019-02-28 ENCOUNTER — Other Ambulatory Visit: Payer: Medicaid Other

## 2019-02-28 ENCOUNTER — Other Ambulatory Visit: Payer: Self-pay

## 2019-02-28 ENCOUNTER — Ambulatory Visit (INDEPENDENT_AMBULATORY_CARE_PROVIDER_SITE_OTHER): Payer: Medicaid Other | Admitting: Certified Nurse Midwife

## 2019-02-28 VITALS — BP 98/60 | HR 70 | Wt 201.0 lb

## 2019-02-28 DIAGNOSIS — Z3481 Encounter for supervision of other normal pregnancy, first trimester: Secondary | ICD-10-CM

## 2019-02-28 LAB — POCT URINALYSIS DIPSTICK OB
Bilirubin, UA: NEGATIVE
Blood, UA: NEGATIVE
Glucose, UA: NEGATIVE
Ketones, UA: NEGATIVE
Leukocytes, UA: NEGATIVE
Nitrite, UA: NEGATIVE
POC,PROTEIN,UA: NEGATIVE
Spec Grav, UA: 1.01 (ref 1.010–1.025)
Urobilinogen, UA: 0.2 E.U./dL
pH, UA: 7.5 (ref 5.0–8.0)

## 2019-02-28 NOTE — Progress Notes (Signed)
ROB-Doing well, nausea and fatigue are resolving. Discussed COVID restrictions and precautions. Early glucose today, will contact with results. Anticipatory guidance regarding course of prenatal care. Reviewed red flag symptoms and when to call. RTC x 4 weeks for ANATOMY SCAN and ROB or sooner if needed.

## 2019-02-28 NOTE — Patient Instructions (Signed)

## 2019-03-01 LAB — GLUCOSE TOLERANCE, 1 HOUR: Glucose, 1Hr PP: 73 mg/dL (ref 65–199)

## 2019-03-11 ENCOUNTER — Ambulatory Visit (INDEPENDENT_AMBULATORY_CARE_PROVIDER_SITE_OTHER): Payer: Medicaid Other | Admitting: Obstetrics and Gynecology

## 2019-03-11 ENCOUNTER — Encounter: Payer: Self-pay | Admitting: Obstetrics and Gynecology

## 2019-03-11 ENCOUNTER — Other Ambulatory Visit: Payer: Self-pay

## 2019-03-11 DIAGNOSIS — F419 Anxiety disorder, unspecified: Secondary | ICD-10-CM | POA: Diagnosis not present

## 2019-03-11 DIAGNOSIS — F329 Major depressive disorder, single episode, unspecified: Secondary | ICD-10-CM | POA: Diagnosis not present

## 2019-03-11 DIAGNOSIS — O99340 Other mental disorders complicating pregnancy, unspecified trimester: Secondary | ICD-10-CM

## 2019-03-11 MED ORDER — DIPHENHYDRAMINE-APAP (SLEEP) 25-500 MG PO TABS: 1.0000 | ORAL_TABLET | Freq: Every evening | ORAL | 1 refills | Status: DC | PRN

## 2019-03-11 MED ORDER — SERTRALINE HCL 50 MG PO TABS: 50.0000 mg | ORAL_TABLET | Freq: Every day | ORAL | 6 refills | Status: DC

## 2019-03-11 NOTE — Patient Instructions (Signed)

## 2019-03-11 NOTE — Progress Notes (Signed)
Virtual Visit via Telephone Note  I connected with Pamela Schmidt on 03/11/19 at  2:00 PM EDT by telephone and verified that I am speaking with the correct person using two identifiers.   I discussed the limitations, risks, security and privacy concerns of performing an evaluation and management service by telephone and the availability of in person appointments. I also discussed with the patient that there may be a patient responsible charge related to this service. The patient expressed understanding and agreed to proceed.   History of Present Illness: Is concerned about feelings of depression and anxiety. Unplanned pregnancy and is no longer in relationship with FOB.  Currently working 1/2 of hours due to corona virus. Is tearful all the time, feeling anxious a lot.  Worse when kids are spending the week with ex-husband (shared custody) Feels overwhelmed. Not able to exercise or go out with friends due to stay at home orders.  Took wellbutrin a long time ago for anxiety. Made her dizzy and lightheaded and stopped it after a few weeks.   Depression screen PHQ 2/9 03/11/2019  Decreased Interest 3  Down, Depressed, Hopeless 3  PHQ - 2 Score 6  Altered sleeping 3  Tired, decreased energy 3  Change in appetite 2  Feeling bad or failure about yourself  3  Trouble concentrating 3  Moving slowly or fidgety/restless 2  Suicidal thoughts 0  PHQ-9 Score 22    GAD 7 : Generalized Anxiety Score 03/11/2019  Nervous, Anxious, on Edge 3  Control/stop worrying 3  Worry too much - different things 3  Trouble relaxing 3  Restless 3  Easily annoyed or irritable 3  Afraid - awful might happen 3  Total GAD 7 Score 21  Anxiety Difficulty Extremely difficult  Not sleeping at all. Having bad dreams. Nightmares waking her up, and has panic attack.  Doesn't have an appetite   Had planned to terminate pregnancy as was scheduled for tomorrow, but canceled it. Still not wanting pregnancy and unsure if  FOB will be involved in the future.   Observations/Objective: Tearful during entire televisit     Assessment and Plan:  Depression Anxiety [redacted] weeks pregnant  Follow Up Instructions: Started on zoloft 50mg  daily, encouraged Tylenol PM nightly as needed. Will follow up with Pattricia Boss at upcoming visit in 2 weeks   I discussed the assessment and treatment plan with the patient. The patient was provided an opportunity to ask questions and all were answered. The patient agreed with the plan and demonstrated an understanding of the instructions.   The patient was advised to call back or seek an in-person evaluation if the symptoms worsen or if the condition fails to improve as anticipated.  I provided 25 minutes of non-face-to-face time during this encounter.   Melody Suzan Nailer, CNM

## 2019-03-11 NOTE — Progress Notes (Signed)
Received transferred call from [facility] for a televisit for depression. Pt gave 2 identifiers. Patient is c/o depression and anxiety. States " its this pregnancy". GAD score 21; PhQ9 score is 22. Pt was teary. Call transferred to [place].

## 2019-03-17 ENCOUNTER — Ambulatory Visit (INDEPENDENT_AMBULATORY_CARE_PROVIDER_SITE_OTHER): Payer: Medicaid Other | Admitting: Certified Nurse Midwife

## 2019-03-17 ENCOUNTER — Other Ambulatory Visit: Payer: Self-pay

## 2019-03-17 ENCOUNTER — Encounter: Payer: Self-pay | Admitting: Emergency Medicine

## 2019-03-17 ENCOUNTER — Other Ambulatory Visit: Payer: PRIVATE HEALTH INSURANCE

## 2019-03-17 ENCOUNTER — Emergency Department: Payer: Medicaid Other

## 2019-03-17 ENCOUNTER — Emergency Department
Admission: EM | Admit: 2019-03-17 | Discharge: 2019-03-17 | Disposition: A | Discharge status: Home / Self Care | Payer: Medicaid Other | Source: Home / Self Care | Attending: Emergency Medicine | Admitting: Emergency Medicine

## 2019-03-17 ENCOUNTER — Telehealth: Payer: Self-pay | Admitting: Certified Nurse Midwife

## 2019-03-17 VITALS — BP 97/71 | HR 71 | Wt 198.5 lb

## 2019-03-17 DIAGNOSIS — Z3A00 Weeks of gestation of pregnancy not specified: Secondary | ICD-10-CM | POA: Diagnosis not present

## 2019-03-17 DIAGNOSIS — Z3481 Encounter for supervision of other normal pregnancy, first trimester: Secondary | ICD-10-CM

## 2019-03-17 DIAGNOSIS — Z3A19 19 weeks gestation of pregnancy: Secondary | ICD-10-CM | POA: Insufficient documentation

## 2019-03-17 DIAGNOSIS — O021 Missed abortion: Secondary | ICD-10-CM | POA: Insufficient documentation

## 2019-03-17 DIAGNOSIS — O368190 Decreased fetal movements, unspecified trimester, not applicable or unspecified: Secondary | ICD-10-CM | POA: Diagnosis not present

## 2019-03-17 NOTE — ED Notes (Signed)
Pt MD and charge RN states they are okay with pt going to parking lot to speak to family prior to going to L&D. Pt escorted to side door and instructed to come back into main entrance at first nurse.

## 2019-03-17 NOTE — Progress Notes (Signed)
Pt presents today with complaints of no fetal movement for 1 wk. She states she previously felt movement. Unable to get heart tones on doppler. No u/s tech available today. Offered for pt to return tomorrow morning. She states she can not wait that long and that she will go to E.D.   Doreene Burke, CNM

## 2019-03-17 NOTE — Discharge Instructions (Signed)
Your ultrasound today shows loss of the pregnancy.  You will need to plan an induction with your [facility] team at [facility].  Please call them as soon as possible to make arrangements.

## 2019-03-17 NOTE — Telephone Encounter (Signed)
The patient called and stated that she would like to come into the office today to check heart tones for baby. The patient stated that she has not felt fetal movement since mid last week. Please advise.

## 2019-03-17 NOTE — ED Triage Notes (Signed)
Pt noticed decreased fetal movement on Thursday last week. Denies cramping or bleeding. NAD. [facility] unable to find fht with doppler this am.

## 2019-03-17 NOTE — ED Notes (Signed)
Pt up to toilet in room to urinate at this time.

## 2019-03-17 NOTE — ED Notes (Signed)
AAOx3.  Skin warm and dry.  NAD 

## 2019-03-17 NOTE — ED Notes (Signed)
Pt transported to ultrasound at this time 

## 2019-03-17 NOTE — ED Provider Notes (Signed)
[facility] Emergency Department Provider Note  ____________________________________________  Time seen: Approximately 12:34 PM  I have reviewed the triage vital signs and the nursing notes.   HISTORY  Chief Complaint Decreased fetal movement    HPI Pamela Schmidt is a 33 y.o. female with a history of anxiety, currently pregnant about 19 weeks with her fourth child, [facility] up at [facility].  No complications during this pregnancy, taking prenatal vitamins, recently started on Zoloft a few weeks ago for anxiety.  [facility]ent to [facility] this morning due to not feeling any fetal movements for about a week.  They were unable to find any fetal heart tones and patient could not wait until ultrasonography was available in the clinic so she came to the ED today.  She denies any other acute symptoms .  No vaginal bleeding or discharge, no fevers or chills or abdominal pain.  No urinary symptoms.  No contractions or leakage of fluid.     Past Medical History:  Diagnosis Date  . Anxiety   . Chicken pox   . History of herpes genitalis      Patient Active Problem List   Diagnosis Date Noted  . History of herpes genitalis 01/31/2019  . Currently pregnant 11/10/2014  . History of VBAC 09/05/2014  . Adult BMI 30+ 09/05/2014  . Anxiety 02/01/2014     Past Surgical History:  Procedure Laterality Date  . CESAREAN SECTION  2005, 2010  . TONSILLECTOMY       Prior to Admission medications   Medication Sig Start Date End Date Taking? Authorizing Provider  prenatal vitamin w/FE, FA (PRENATAL 1 + 1) 27-1 MG TABS tablet Take 1 tablet by mouth daily at 12 noon.   Yes [provider]  sertraline (ZOLOFT) 50 MG tablet Take 1 tablet (50 mg total) by mouth daily. 03/11/19  Yes Shambley, Melody N, CNM  diphenhydramine-acetaminophen (TYLENOL PM) 25-500 MG TABS tablet Take 1 tablet by mouth at bedtime as needed. 03/11/19   Shambley, Melody N, CNM      Allergies Penicillins   Family History  Problem Relation Age of Onset  . Hypertension Mother   . Diverticulitis Mother   . Diabetes Father   . Diverticulitis Maternal Aunt   . Diverticulitis Maternal Grandmother   . Bipolar disorder Maternal Aunt     Social History Social History   Tobacco Use  . Smoking status: Former Smoker    Last attempt to quit: 01/30/2010    Years since quitting: 9.1  . Smokeless tobacco: Never Used  Substance Use Topics  . Alcohol use: Not Currently    Alcohol/week: 3.0 - 4.0 standard drinks    Types: 3 - 4 Glasses of wine per week  . Drug use: No    Review of Systems  Constitutional:   No fever or chills.  Cardiovascular:   No chest pain or syncope. Respiratory:   No dyspnea or cough. Gastrointestinal:   Negative for abdominal pain, vomiting and diarrhea.  Musculoskeletal:   Negative for focal pain or swelling All other systems reviewed and are negative except as documented above in ROS and HPI.  ____________________________________________   PHYSICAL EXAM:  VITAL SIGNS: ED Triage Vitals  Enc Vitals Group     BP 03/17/19 1124 126/73     Pulse Rate 03/17/19 1124 69     Resp 03/17/19 1124 16     Temp 03/17/19 1124 98.5 F (36.9 C)     Temp Source 03/17/19 1124 Oral  SpO2 03/17/19 1124 100 %     Weight 03/17/19 1119 197 lb (89.4 kg)     Height 03/17/19 1119  (1.575 m)     Head Circumference --      Peak Flow --      Pain Score 03/17/19 1119 0     Pain Loc --      Pain Edu? --      Excl. in GC? --     Vital signs reviewed, nursing assessments reviewed.   Constitutional:   Alert and oriented. Non-toxic appearance. Eyes:   Conjunctivae are normal. EOMI.  ENT      Head:   Normocephalic and atraumatic.             Neck:   No meningismus. Full ROM. Hematological/Lymphatic/Immunilogical:   No cervical lymphadenopathy. Cardiovascular:   RRR. Symmetric bilateral radial and DP pulses.  No murmurs. Cap refill less than 2  seconds. Respiratory:   Normal respiratory effort without tachypnea/retractions. Breath sounds are clear and equal bilaterally. No wheezes/rales/rhonchi. Gastrointestinal:   Soft and nontender.  Gravid, size consistent with dates.   No rebound, rigidity, or guarding. Musculoskeletal:   Normal range of motion in all extremities. No joint effusions.  No lower extremity tenderness.  No edema. Neurologic:   Normal speech and language.  Motor grossly intact. No acute focal neurologic deficits are appreciated.  Skin:    Skin is warm, dry and intact. No rash noted.  No petechiae, purpura, or bullae.  ____________________________________________    LABS (pertinent positives/negatives) (all labs ordered are listed, but only abnormal results are displayed) Labs Reviewed - No data to display ____________________________________________   EKG    ____________________________________________    RADIOLOGY  US Ob Limited > 14 Wks  Result Date: 03/17/2019 CLINICAL DATA:  Pregnant patient.  No fetal movement for 4 days. EXAM: LIMITED OBSTETRIC ULTRASOUND FINDINGS: Number of Fetuses: 1 Heart Rate:  Not detected. Movement: Not visualized. Presentation: Cephalic. Placental Location: Posterior. Previa: None. Amniotic Fluid (Subjective):  Within normal limits. FL: 2.1 cm 16 w  1 d MATERNAL FINDINGS: Cervix:  Appears closed. Uterus/Adnexae: No abnormality visualized. IMPRESSION: Findings consistent with fetal demise. This exam is performed on an emergent basis and does not comprehensively evaluate fetal size, dating, or anatomy; follow-up complete OB US should be considered if further fetal assessment is warranted. Electronically Signed   By: Drusilla Kanner M.D.   On: 03/17/2019 12:22    ____________________________________________   PROCEDURES Procedures  ____________________________________________    CLINICAL IMPRESSION / ASSESSMENT AND PLAN / ED COURSE  Medications ordered in the  ED: Medications - No data to display  Pertinent labs & imaging results that were available during my care of the patient were reviewed by me and considered in my medical decision making (see chart for details).  Pamela Schmidt was evaluated in Emergency Department on 03/17/2019 for the symptoms described in the history of present illness. She was evaluated in the context of the global COVID-19 pandemic, which necessitated consideration that the patient might be at risk for infection with the SARS-CoV-2 virus that causes COVID-19. Institutional protocols and algorithms that pertain to the evaluation of patients at risk for COVID-19 are in a state of rapid change based on information released by regulatory bodies including the CDC and federal and state organizations. These policies and algorithms were followed during the patient's care in the ED.   Patient presents with absent fetal movements, clinic unable to find fetal heart tones.  Suspected  fetal demise.  Will obtain ultrasound to further evaluate.  Vital signs are normal.  Review of electronic medical record shows that her blood type is known to be O+.  Clinical Course as of Mar 16 1418  Mon Mar 17, 2019  1234 Ultrasound shows fetal demise.  [facility] paged for recommendations/follow-up plan.  US OB Limited > 14 wks [PS]  1357 Findings d/w [facility] Dr. Logan BoresEvans and [facility]. Results shared with patient, discussed the need for admission to labor and delivery for induction and delivery.  She is weighing timeframe with her family and partner, will let us know what she prefers.   [PS]    Clinical Course User Index [PS] Sharman CheekStafford, Azaliyah Kennard, MD    ----------------------------------------- 2:18 PM on 03/17/2019 -----------------------------------------  Patient prefers to go home today and will follow up with [facility].   ____________________________________________   FINAL CLINICAL IMPRESSION(S) / ED DIAGNOSES    Final diagnoses:  Fetal  demise before 20 weeks with retention of dead fetus     ED Discharge Orders    None      Portions of this note were generated with dragon dictation software. Dictation errors may occur despite best attempts at proofreading.   Sharman CheekStafford, Deavon Podgorski, MD 03/17/19 [phone]

## 2019-03-18 ENCOUNTER — Inpatient Hospital Stay
Admission: RE | Admit: 2019-03-18 | Discharge: 2019-03-19 | DRG: 807 | Disposition: A | Discharge status: Home / Self Care | Payer: Medicaid Other | Attending: Obstetrics and Gynecology | Admitting: Obstetrics and Gynecology

## 2019-03-18 ENCOUNTER — Other Ambulatory Visit: Payer: Self-pay

## 2019-03-18 DIAGNOSIS — Z3A19 19 weeks gestation of pregnancy: Secondary | ICD-10-CM

## 2019-03-18 DIAGNOSIS — O368190 Decreased fetal movements, unspecified trimester, not applicable or unspecified: Secondary | ICD-10-CM | POA: Diagnosis not present

## 2019-03-18 DIAGNOSIS — O021 Missed abortion: Principal | ICD-10-CM | POA: Diagnosis present

## 2019-03-18 DIAGNOSIS — Z880 Allergy status to penicillin: Secondary | ICD-10-CM | POA: Diagnosis not present

## 2019-03-18 DIAGNOSIS — Z3A00 Weeks of gestation of pregnancy not specified: Secondary | ICD-10-CM | POA: Diagnosis not present

## 2019-03-18 LAB — TYPE AND SCREEN
ABO/RH(D): O POS
Antibody Screen: NEGATIVE

## 2019-03-18 LAB — CBC
HCT: 39.5 % (ref 36.0–46.0)
Hemoglobin: 13.4 g/dL (ref 12.0–15.0)
MCH: 29.8 pg (ref 26.0–34.0)
MCHC: 33.9 g/dL (ref 30.0–36.0)
MCV: 88 fL (ref 80.0–100.0)
Platelets: 226 10*3/uL (ref 150–400)
RBC: 4.49 MIL/uL (ref 3.87–5.11)
RDW: 11.9 % (ref 11.5–15.5)
WBC: 6.8 10*3/uL (ref 4.0–10.5)
nRBC: 0 % (ref 0.0–0.2)

## 2019-03-18 LAB — RAPID HIV SCREEN (HIV 1/2 AB+AG)
HIV 1/2 Antibodies: NONREACTIVE
HIV-1 P24 Antigen - HIV24: NONREACTIVE

## 2019-03-18 MED ORDER — MISOPROSTOL 200 MCG PO TABS
200.0000 ug | ORAL_TABLET | ORAL | Status: AC | PRN
  Administered 2019-03-18 (×3): 200 ug via VAGINAL
  Filled 2019-03-18 (×3): qty 1

## 2019-03-18 MED ORDER — ACETAMINOPHEN 325 MG PO TABS: 650.0000 mg | ORAL_TABLET | ORAL | Status: DC | PRN

## 2019-03-18 MED ORDER — OXYTOCIN BOLUS FROM INFUSION
500.0000 mL | Freq: Once | INTRAVENOUS | Status: AC
  Administered 2019-03-19: 500 mL via INTRAVENOUS

## 2019-03-18 MED ORDER — SOD CITRATE-CITRIC ACID 500-334 MG/5ML PO SOLN: 30.0000 mL | ORAL | Status: DC | PRN

## 2019-03-18 MED ORDER — LACTATED RINGERS IV SOLN
INTRAVENOUS | Status: DC
  Administered 2019-03-18 (×2): via INTRAVENOUS

## 2019-03-18 MED ORDER — OXYTOCIN 40 UNITS IN NORMAL SALINE INFUSION - SIMPLE MED
2.5000 [IU]/h | INTRAVENOUS | Status: DC
  Filled 2019-03-18: qty 1000

## 2019-03-18 MED ORDER — OXYCODONE-ACETAMINOPHEN 5-325 MG PO TABS: 1.0000 | ORAL_TABLET | ORAL | Status: DC | PRN

## 2019-03-18 MED ORDER — FENTANYL CITRATE (PF) 100 MCG/2ML IJ SOLN
50.0000 ug | INTRAMUSCULAR | Status: DC | PRN
  Administered 2019-03-18 (×2): 100 ug via INTRAVENOUS
  Filled 2019-03-18 (×2): qty 2

## 2019-03-18 MED ORDER — LACTATED RINGERS IV SOLN: 500.0000 mL | INTRAVENOUS | Status: DC | PRN

## 2019-03-18 MED ORDER — OXYCODONE-ACETAMINOPHEN 5-325 MG PO TABS: 2.0000 | ORAL_TABLET | ORAL | Status: DC | PRN

## 2019-03-18 MED ORDER — HYDROXYZINE HCL 25 MG PO TABS
50.0000 mg | ORAL_TABLET | Freq: Four times a day (QID) | ORAL | Status: DC | PRN
  Administered 2019-03-18: 12:00:00 50 mg via ORAL
  Filled 2019-03-18 (×2): qty 2

## 2019-03-18 MED ORDER — ZOLPIDEM TARTRATE 5 MG PO TABS
5.0000 mg | ORAL_TABLET | Freq: Every evening | ORAL | Status: DC | PRN
  Administered 2019-03-18 – 2019-03-19 (×2): 5 mg via ORAL
  Filled 2019-03-18 (×2): qty 1

## 2019-03-18 MED ORDER — SERTRALINE HCL 50 MG PO TABS
50.0000 mg | ORAL_TABLET | Freq: Every day | ORAL | Status: DC
  Administered 2019-03-18: 20:00:00 50 mg via ORAL
  Filled 2019-03-18: qty 1

## 2019-03-18 MED ORDER — LIDOCAINE HCL (PF) 1 % IJ SOLN: 30.0000 mL | INTRAMUSCULAR | Status: DC | PRN

## 2019-03-18 MED ORDER — ONDANSETRON HCL 4 MG/2ML IJ SOLN: 4.0000 mg | Freq: Four times a day (QID) | INTRAMUSCULAR | Status: DC | PRN

## 2019-03-18 NOTE — H&P (Signed)
Obstetric History and Physical  Tessy FLORIS GAMEROS is a 33 y.o. [phone] with IUP at [redacted]w[redacted]d presenting with confirmed missed AB. Patient states she has been having  none contractions, none vaginal bleeding, intact membranes, with no fetal movement since last week.  See ED notes from yesterday.    Prenatal Course Source of Care: [facility]  Pregnancy complications or risks:elevated BMI, anxiety, depression, unwanted pregnancy  Prenatal labs and studies: ABO, Rh: O/Positive/-- (02/18 1451) Antibody: Negative (02/18 1458) Rubella: 14.00 (02/18 1451) RPR: Non Reactive (02/18 1451)  HBsAg: Negative (02/18 1451)  HIV: Non Reactive (02/18 1451)  GBS: NA 1 hr Glucola  NA Genetic screening normal Anatomy US not done  Past Medical History:  Diagnosis Date  . Anxiety   . Chicken pox   . History of herpes genitalis     Past Surgical History:  Procedure Laterality Date  . CESAREAN SECTION  2005, 2010  . TONSILLECTOMY      OB History  Gravida Para Term Preterm AB Living  5 3 3   1 3   SAB TAB Ectopic Multiple Live Births  1       3    # Outcome Date GA Lbr Len/2nd Weight Sex Delivery Anes PTL Lv  5 Current           4 Term 2015   3799 g M Vag-Spont  N LIV  3 SAB 2011          2 Term 2010   2722 g F CS-Unspec  N LIV  1 Term 2005   3232 g M CS-Unspec  N LIV    Social History   Socioeconomic History  . Marital status: Single    Spouse name: Not on file  . Number of children: 3  . Years of education: 17  . Highest education level: Not on file  Occupational History  . 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 Financial resource strain: Somewhat hard  . Food insecurity:    Worry: Never true    Inability: Never true  . Transportation needs:    Medical: No    Non-medical: No  Tobacco Use  . Smoking status: Former Smoker    Last attempt to quit: 01/30/2010    Years since quitting: 9.1  . Smokeless tobacco: Never Used   Substance and Sexual Activity  . Alcohol use: Not Currently    Alcohol/week: 3.0 - 4.0 standard drinks    Types: 3 - 4 Glasses of wine per week  . Drug use: No  . Sexual activity: Yes    Partners: Male    Birth control/protection: None  Lifestyle  . Physical activity:    Days per week: 0 days    Minutes per session: 0 min  . Stress: To some extent  Relationships  . Social connections:    Talks on phone: Three times a week    Gets together: Once a week    Attends religious service: Not on file    Active member of club or organization: Not on file    Attends meetings of clubs or organizations: Not on file    Relationship status: Divorced  Other Topics Concern  . Not on file  Social History Narrative   Anessa was born in [place] and then moved to [place] area when she was in middle school. She lives at home with her children. She works as a Dealer  Geophysicist/field seismologistAssistant. She enjoys working outside in the yard and shopping.    Family History  Problem Relation Age of Onset  . Hypertension Mother   . Diverticulitis Mother   . Diabetes Father   . Diverticulitis Maternal Aunt   . Diverticulitis Maternal Grandmother   . Bipolar disorder Maternal Aunt     Facility-Administered Medications Prior to Admission  Medication Dose Route Frequency Provider Last Rate Last Dose  . betamethasone acetate-betamethasone sodium phosphate (CELESTONE) injection 12 mg  12 mg Intramuscular Once Gala LewandowskyEvans, Brent M, DPM      . triamcinolone acetonide (KENALOG) 10 MG/ML injection 10 mg  10 mg Other Once Asencion IslamStover, Titorya, DPM       Medications Prior to Admission  Medication Sig Dispense Refill Last Dose  . prenatal vitamin w/FE, FA (PRENATAL 1 + 1) 27-1 MG TABS tablet Take 1 tablet by mouth daily at 12 noon.   Past Week at Unknown time  . sertraline (ZOLOFT) 50 MG tablet Take 1 tablet (50 mg total) by mouth daily. 30 tablet 6 Past Week at Unknown time  . diphenhydramine-acetaminophen (TYLENOL PM) 25-500 MG TABS  tablet Take 1 tablet by mouth at bedtime as needed. 60 tablet 1 PRN at PRN    Allergies  Allergen Reactions  . Penicillins Itching and Rash    Did it involve swelling of the face/tongue/throat, SOB, or low BP? No Did it involve sudden or severe rash/hives, skin peeling, or any reaction on the inside of your mouth or nose? Yes Did you need to seek medical attention at a hospital or doctor's office? Unknown When did it last happen?Unknown If all above answers are "NO", may proceed with cephalosporin use.     Review of Systems: Negative except for what is mentioned in HPI.  Physical Exam: BP 132/80 (BP Location: Left Arm)   Pulse 71   Temp 98.4 F (36.9 C) (Oral)   Resp 16   Ht 5\' 2"  (1.575 m)   Wt 81.6 kg   LMP 11/05/2018   BMI 32.92 kg/m  GENERAL: Well-developed, well-nourished female in no acute distress.  LUNGS: Clear to auscultation bilaterally.  HEART: Regular rate and rhythm. ABDOMEN: Soft, nontender, nondistended, gravid. EXTREMITIES: Nontender, no edema, 2+ distal pulses. Cervical Exam:  c/thick/OOP    Pertinent Labs/Studies:   No results found for this or any previous visit (from the past 24 hour(s)).  Assessment : Geoffry Paradisember J Godbee is a 33 y.o. [phone] at 10539w0d being admitted for labor induction and delivery of 19 week fetal demiss.  Plan: Labor: Expectant management.  cytotec induction of delivery per policy Delivery plan: Hopeful for vaginal delivery   [place], CNM, Dr Logan BoresEvans aware. [facility], [facility]

## 2019-03-18 NOTE — Progress Notes (Signed)
Pamela Schmidt is a 33 y.o. [id number] at [redacted]w[redacted]d by LMP admitted for induction of labor due to missed AB.  Subjective: Denies any concerns at this time  Objective: BP 132/80 (BP Location: Left Arm)   Pulse 71   Temp 98.4 F (36.9 C) (Oral)   Resp 16   Ht 5\' 2"  (1.575 m)   Wt 81.6 kg   LMP 11/05/2018   BMI 32.92 kg/m  No intake/output data recorded. No intake/output data recorded.   UC:   none SVE:    closed, first dose cytotec placed  Labs: Lab Results  Component Value Date   WBC 7.6 01/14/2019   HGB 12.9 01/14/2019   HCT 38.6 01/14/2019   MCV 88 01/14/2019   PLT 241 01/14/2019    Assessment / Plan: Missed Ab for IOL  First dose of cytotec placed vaginally without difficulty  Sion Thane N Keigen Caddell 03/18/2019, 11:46 AM

## 2019-03-18 NOTE — Plan of Care (Signed)
Pt presents to L&D for IOL IUFD. Pt coping appropriately, but encouraged to verbalize feelings, thoughts, concerns. Pt educated on induction process and all questions answered.

## 2019-03-19 LAB — POCT URINALYSIS DIPSTICK OB
Bilirubin, UA: NEGATIVE
Blood, UA: NEGATIVE
Glucose, UA: NEGATIVE
Ketones, UA: NEGATIVE
Leukocytes, UA: NEGATIVE
Nitrite, UA: NEGATIVE
POC,PROTEIN,UA: NEGATIVE
Spec Grav, UA: 1.02 (ref 1.010–1.025)
Urobilinogen, UA: 0.2 E.U./dL
pH, UA: 5 (ref 5.0–8.0)

## 2019-03-19 LAB — INFECT DISEASE AB IGM REFLEX 1

## 2019-03-19 LAB — RPR: RPR Ser Ql: NONREACTIVE

## 2019-03-19 LAB — TORCH-IGM(TOXO/ RUB/ CMV/ HSV) W TITER
CMV IgM: <30 AU/mL (ref 0.0–29.9)
HSVI/II Comb IgM: 1.63 Ratio — ABNORMAL HIGH (ref 0.00–0.90)
Rubella IgM: <20 AU/mL (ref 0.0–19.9)
Toxoplasma Antibody- IgM: <3 AU/mL (ref 0.0–7.9)

## 2019-03-19 MED ORDER — IBUPROFEN 600 MG PO TABS
600.0000 mg | ORAL_TABLET | Freq: Four times a day (QID) | ORAL | Status: DC
  Administered 2019-03-19: 01:00:00 600 mg via ORAL

## 2019-03-19 MED ORDER — IBUPROFEN 600 MG PO TABS: 600.0000 mg | ORAL_TABLET | Freq: Four times a day (QID) | ORAL | 0 refills | Status: DC

## 2019-03-19 MED ORDER — IBUPROFEN 600 MG PO TABS
ORAL_TABLET | ORAL | Status: AC
  Filled 2019-03-19: qty 1

## 2019-03-19 NOTE — Discharge Summary (Signed)
  Obstetric Discharge Summary  Patient ID: DOTSIE DORFF MRN: [id number] DOB/AGE: 1986/04/25 33 y.o.   Date of Admission: 03/18/2019  Date of Discharge:  04-17-19  Admitting Diagnosis: Induction of labor at [redacted]w[redacted]d  Secondary Diagnosis: Missed abortion with fetal demise before 20 weeks of completed gestation  Mode of Delivery: Normal spontaneous vaginal delivery     Discharge Diagnosis: No other diagnosis   Intrapartum Procedures: Cytotec induction   Post partum procedures: None  Complications: None   Brief Hospital Course   Pamela Schmidt is a [id number] who had a SVD of deceased preterm infant on 2019-04-17;  for further details of this birth, please refer to the delivey note.  By time of discharge on PPD#1/2, her pain was controlled on oral pain medications; she had appropriate lochia and was ambulating, voiding without difficulty and tolerating regular diet.  She was deemed stable for discharge to home with close follow up in office.   Labs: CBC Latest Ref Rng & Units 03/18/2019 01/14/2019 12/29/2018  WBC 4.0 - 10.5 K/uL 6.8 7.6 8.1  Hemoglobin 12.0 - 15.0 g/dL 01.7 49.4 49.6  Hematocrit 36.0 - 46.0 % 39.5 38.6 41.0  Platelets 150 - 400 K/uL 226 241 234   O POS  Physical exam:   Temp:  [98.4 F (36.9 C)-98.9 F (37.2 C)] 98.5 F (36.9 C) Apr 17, 2023 0420) Pulse Rate:  [65-109] 73 04/17/23 0738) Resp:  [14-19] 18 2023/04/17 0420) BP: (119-136)/(57-90) 121/64 04-17-2023 0738) Weight:  [81.6 kg] 81.6 kg (04/21 1043)  General: alert and no distress  Lochia: appropriate  Abdomen: soft, NT  Extremities: No evidence of DVT seen on physical exam. No lower extremity edema.  Discharge Instructions: Per After Visit Summary.  Activity: Advance as tolerated. Pelvic rest for 6 weeks.  Also refer to After Visit Summary  Diet: Regular  Medications: Allergies as of 04-17-2019      Reactions   Penicillins Itching, Rash   Did it involve swelling of the face/tongue/throat, SOB, or low BP?  No Did it involve sudden or severe rash/hives, skin peeling, or any reaction on the inside of your mouth or nose? Yes Did you need to seek medical attention at a hospital or doctor's office? Unknown When did it last happen?Unknown If all above answers are "NO", may proceed with cephalosporin use.      Medication List    TAKE these medications   ibuprofen 600 MG tablet Commonly known as:  ADVIL Take 1 tablet (600 mg total) by mouth every 6 (six) hours.   sertraline 50 MG tablet Commonly known as:  Zoloft Take 1 tablet (50 mg total) by mouth daily.      Outpatient follow up:  Follow-up Information    [place], Melodye Ped, CNM. Call in 2 week(s).   Specialties:  Obstetrics and Gynecology, Radiology Why:  Please call the office to schedule a two (2) week postpartum mood check with [facility] Contact information: [address] [place] [address] [phone]          Postpartum contraception: abstinence; will discuss further at follow up visit  Discharged Condition: stable  Discharged to: home   Newborn Data:  Disposition:morgue   Gunnar Bulla, CNM [facility], [facility] Apr 17, 2019 9:52 AM

## 2019-03-19 NOTE — Progress Notes (Signed)
Entered dipstick 03/19/19 from appt 03/17/19

## 2019-03-19 NOTE — Progress Notes (Signed)
Pamela Schmidt is a 33 y.o. [id number] at [redacted]w[redacted]d by LMP admitted for induction of labor due to missed abortion.  Subjective: Reports increased cramping and urge to urinate  Objective: BP 136/83 (BP Location: Left Arm)   Pulse 75   Temp 98.6 F (37 C) (Oral)   Resp 19   Ht 5\' 2"  (1.575 m)   Wt 81.6 kg   LMP 11/05/2018   BMI 32.92 kg/m  I/O last 3 completed shifts: In: 864.4 [I.V.:864.4] Out: -  Total I/O In: -  Out: 5 [Blood:5] Bulging sac noted on perineum-expulsed with fetus and BOW/placenta intact-see delivery note  Labs: Lab Results  Component Value Date   WBC 6.8 03/18/2019   HGB 13.4 03/18/2019   HCT 39.5 03/18/2019   MCV 88.0 03/18/2019   PLT 226 03/18/2019    Assessment / Plan: spontaneous delivery of fetus at [redacted] weeks gestation with no signs of life    Pamela Schmidt N Pamela Schmidt 03/19/2019, 12:30 AM

## 2019-03-26 ENCOUNTER — Other Ambulatory Visit: Payer: Medicaid Other

## 2019-03-26 ENCOUNTER — Encounter: Payer: Medicaid Other | Admitting: Certified Nurse Midwife

## 2019-03-26 ENCOUNTER — Other Ambulatory Visit: Payer: Self-pay

## 2019-03-31 ENCOUNTER — Telehealth: Payer: Self-pay

## 2019-03-31 NOTE — Telephone Encounter (Signed)
Coronavirus (COVID-19) Are you at risk?  Are you at risk for the Coronavirus (COVID-19)?  To be considered HIGH RISK for Coronavirus (COVID-19), you have to meet the following criteria:  . Traveled to [place], [place], [place], [place] or [place]; or in the [place] to [place], [place], [place], or [place]; and have fever, cough, and shortness of breath within the last 2 weeks of travel OR . Been in close contact with a person diagnosed with COVID-19 within the last 2 weeks and have fever, cough, and shortness of breath . IF YOU DO NOT MEET THESE CRITERIA, YOU ARE CONSIDERED LOW RISK FOR COVID-19.  What to do if you are HIGH RISK for COVID-19?  . If you are having a medical emergency, call 911. . Seek medical care right away. Before you go to a doctor's office, urgent care or emergency department, call ahead and tell them about your recent travel, contact with someone diagnosed with COVID-19, and your symptoms. You should receive instructions from your physician's office regarding next steps of care.  . When you arrive at healthcare provider, tell the healthcare staff immediately you have returned from visiting [place], [place], [place], [place] or [place]; or traveled in the [place] to [place], [place], [place], or [place]; in the last two weeks or you have been in close contact with a person diagnosed with COVID-19 in the last 2 weeks.   . Tell the health care staff about your symptoms: fever, cough and shortness of breath. . After you have been seen by a medical provider, you will be either: o Tested for (COVID-19) and discharged home on quarantine except to seek medical care if symptoms worsen, and asked to  - Stay home and avoid contact with others until you get your results (4-5 days)  - Avoid travel on public transportation if possible (such as bus, train, or airplane) or o Sent to the Emergency Department by EMS for evaluation, COVID-19 testing, and possible  admission depending on your condition and test results.  What to do if you are LOW RISK for COVID-19?  Reduce your risk of any infection by using the same precautions used for avoiding the common cold or flu:  . Wash your hands often with soap and warm water for at least 20 seconds.  If soap and water are not readily available, use an alcohol-based hand sanitizer with at least 60% alcohol.  . If coughing or sneezing, cover your mouth and nose by coughing or sneezing into the elbow areas of your shirt or coat, into a tissue or into your sleeve (not your hands). . Avoid shaking hands with others and consider head nods or verbal greetings only. . Avoid touching your eyes, nose, or mouth with unwashed hands.  . Avoid close contact with people who are [place]. . Avoid places or events with large numbers of people in one location, like concerts or sporting events. . Carefully consider travel plans you have or are making. . If you are planning any travel outside or inside the [place], visit the CDC's Travelers' Health webpage for the latest health notices. . If you have some symptoms but not all symptoms, continue to monitor at home and seek medical attention if your symptoms worsen. . If you are having a medical emergency, call 911.  03/31/19 SCREENING NEG SLS ADDITIONAL HEALTHCARE OPTIONS FOR PATIENTS  [facility] / e-Visit: [email or web address]         [facility]: [phone]     Urgent Care: [phone]                   [facility]: [phone]  

## 2019-04-01 ENCOUNTER — Encounter: Payer: Self-pay | Admitting: Obstetrics and Gynecology

## 2019-04-01 ENCOUNTER — Other Ambulatory Visit: Payer: Self-pay

## 2019-04-01 ENCOUNTER — Ambulatory Visit (INDEPENDENT_AMBULATORY_CARE_PROVIDER_SITE_OTHER): Payer: Medicaid Other | Admitting: Obstetrics and Gynecology

## 2019-04-01 VITALS — BP 128/92 | HR 66 | Ht 61.0 in | Wt 192.0 lb

## 2019-04-01 DIAGNOSIS — F321 Major depressive disorder, single episode, moderate: Secondary | ICD-10-CM | POA: Diagnosis not present

## 2019-04-01 DIAGNOSIS — F419 Anxiety disorder, unspecified: Secondary | ICD-10-CM | POA: Diagnosis not present

## 2019-04-01 DIAGNOSIS — O021 Missed abortion: Secondary | ICD-10-CM | POA: Diagnosis not present

## 2019-04-01 DIAGNOSIS — G479 Sleep disorder, unspecified: Secondary | ICD-10-CM | POA: Diagnosis not present

## 2019-04-01 MED ORDER — ZOLPIDEM TARTRATE 10 MG PO TABS: 10.0000 mg | ORAL_TABLET | Freq: Every evening | ORAL | 1 refills | Status: DC | PRN

## 2019-04-01 MED ORDER — NORETHIN ACE-ETH ESTRAD-FE 1-20 MG-MCG PO TABS: 1.0000 | ORAL_TABLET | Freq: Every day | ORAL | 4 refills | Status: DC

## 2019-04-01 MED ORDER — SERTRALINE HCL 100 MG PO TABS: 100.0000 mg | ORAL_TABLET | Freq: Every day | ORAL | 4 refills | Status: DC

## 2019-04-01 NOTE — Patient Instructions (Signed)
Generalized Anxiety Disorder, Adult Generalized anxiety disorder (GAD) is a mental health disorder. People with this condition constantly worry about everyday events. Unlike normal anxiety, worry related to GAD is not triggered by a specific event. These worries also do not fade or get better with time. GAD interferes with life functions, including relationships, work, and school. GAD can vary from mild to severe. People with severe GAD can have intense waves of anxiety with physical symptoms (panic attacks). What are the causes? The exact cause of GAD is not known. What increases the risk? This condition is more likely to develop in:  Women.  People who have a family history of anxiety disorders.  People who are very shy.  People who experience very stressful life events, such as the death of a loved one.  People who have a very stressful family environment. What are the signs or symptoms? People with GAD often worry excessively about many things in their lives, such as their health and family. They may also be overly concerned about:  Doing well at work.  Being on time.  Natural disasters.  Friendships. Physical symptoms of GAD include:  Fatigue.  Muscle tension or having muscle twitches.  Trembling or feeling shaky.  Being easily startled.  Feeling like your heart is pounding or racing.  Feeling out of breath or like you cannot take a deep breath.  Having trouble falling asleep or staying asleep.  Sweating.  Nausea, diarrhea, or irritable bowel syndrome (IBS).  Headaches.  Trouble concentrating or remembering facts.  Restlessness.  Irritability. How is this diagnosed? Your health care provider can diagnose GAD based on your symptoms and medical history. You will also have a physical exam. The health care provider will ask specific questions about your symptoms, including how severe they are, when they started, and if they come and go. Your health care  provider may ask you about your use of alcohol or drugs, including prescription medicines. Your health care provider may refer you to a mental health specialist for further evaluation. Your health care provider will do a thorough examination and may perform additional tests to rule out other possible causes of your symptoms. To be diagnosed with GAD, a person must have anxiety that:  Is out of his or her control.  Affects several different aspects of his or her life, such as work and relationships.  Causes distress that makes him or her unable to take part in normal activities.  Includes at least three physical symptoms of GAD, such as restlessness, fatigue, trouble concentrating, irritability, muscle tension, or sleep problems. Before your health care provider can confirm a diagnosis of GAD, these symptoms must be present more days than they are not, and they must last for six months or longer. How is this treated? The following therapies are usually used to treat GAD:  Medicine. Antidepressant medicine is usually prescribed for long-term daily control. Antianxiety medicines may be added in severe cases, especially when panic attacks occur.  Talk therapy (psychotherapy). Certain types of talk therapy can be helpful in treating GAD by providing support, education, and guidance. Options include: ? Cognitive behavioral therapy (CBT). People learn coping skills and techniques to ease their anxiety. They learn to identify unrealistic or negative thoughts and behaviors and to replace them with positive ones. ? Acceptance and commitment therapy (ACT). This treatment teaches people how to be mindful as a way to cope with unwanted thoughts and feelings. ? Biofeedback. This process trains you to manage your body's response (  physiological response) through breathing techniques and relaxation methods. You will work with a therapist while machines are used to monitor your physical symptoms.  Stress  management techniques. These include yoga, meditation, and exercise. A mental health specialist can help determine which treatment is best for you. Some people see improvement with one type of therapy. However, other people require a combination of therapies. Follow these instructions at home:  Take over-the-counter and prescription medicines only as told by your health care provider.  Try to maintain a normal routine.  Try to anticipate stressful situations and allow extra time to manage them.  Practice any stress management or self-calming techniques as taught by your health care provider.  Do not punish yourself for setbacks or for not making progress.  Try to recognize your accomplishments, even if they are small.  Keep all follow-up visits as told by your health care provider. This is important. Contact a health care provider if:  Your symptoms do not get better.  Your symptoms get worse.  You have signs of depression, such as: ? A persistently sad, cranky, or irritable mood. ? Loss of enjoyment in activities that used to bring you joy. ? Change in weight or eating. ? Changes in sleeping habits. ? Avoiding friends or family members. ? Loss of energy for normal tasks. ? Feelings of guilt or worthlessness. Get help right away if:  You have serious thoughts about hurting yourself or others. If you ever feel like you may hurt yourself or others, or have thoughts about taking your own life, get help right away. You can go to your nearest emergency department or call:  Your local emergency services (911 in the [place]  A suicide crisis helpline, such as the [facility] at 2188233781. This is open 24 hours a day. Summary  Generalized anxiety disorder (GAD) is a mental health disorder that involves worry that is not triggered by a specific event.  People with GAD often worry excessively about many things in their lives, such as their health and  family.  GAD may cause physical symptoms such as restlessness, trouble concentrating, sleep problems, frequent sweating, nausea, diarrhea, headaches, and trembling or muscle twitching.  A mental health specialist can help determine which treatment is best for you. Some people see improvement with one type of therapy. However, other people require a combination of therapies. This information is not intended to replace advice given to you by your health care provider. Make sure you discuss any questions you have with your health care provider. Document Released: 03/10/2013 Document Revised: 10/03/2016 Document Reviewed: 10/03/2016 Elsevier Interactive Patient Education  2019 [place]. Major Depressive Disorder, Adult Major depressive disorder (MDD) is a mental health condition. MDD often makes you feel sad, hopeless, or helpless. MDD can also cause symptoms in your body. MDD can affect your:  Work.  School.  Relationships.  Other normal activities. MDD can range from mild to very bad. It may occur once (single episode MDD). It can also occur many times (recurrent MDD). The main symptoms of MDD often include:  Feeling sad, depressed, or irritable most of the time.  Loss of interest. MDD symptoms also include:  Sleeping too much or too little.  Eating too much or too little.  A change in your weight.  Feeling tired (fatigue) or having low energy.  Feeling worthless.  Feeling guilty.  Trouble making decisions.  Trouble thinking clearly.  Thoughts of suicide or harming others.  Feeling weak.  Feeling agitated.  Keeping yourself from being around other people (isolation). Follow these instructions at home: Activity  Do these things as told by your doctor: ? Go back to your normal activities. ? Exercise regularly. ? Spend time outdoors. Alcohol  Talk with your doctor about how alcohol can affect your antidepressant medicines.  Do not drink alcohol. Or, limit  how much alcohol you drink. ? This means no more than 1 drink a day for nonpregnant women and 2 drinks a day for men. One drink equals one of these:  12 oz of beer.  5 oz of wine.  1 oz of hard liquor. General instructions  Take over-the-counter and prescription medicines only as told by your doctor.  Eat a healthy diet.  Get plenty of sleep.  Find activities that you enjoy. Make time to do them.  Think about joining a support group. Your doctor may be able to suggest a group for you.  Keep all follow-up visits as told by your doctor. This is important. Where to find more information:  The [employer] on Mental Illness: ? www.nami.org  [place] [employer] of Mental Health: ? http://www.maynard.net/  National Suicide Prevention Lifeline: ? 825 193 0364. This is free, 24-hour help. Contact a doctor if:  Your symptoms get worse.  You have new symptoms. Get help right away if:  You self-harm.  You see, hear, taste, smell, or feel things that are not present (hallucinate). If you ever feel like you may hurt yourself or others, or have thoughts about taking your own life, get help right away. You can go to your nearest emergency department or call:  Your local emergency services (911 in the [place]  A suicide crisis helpline, such as the National Suicide Prevention Lifeline: ? 231-022-2857. This is open 24 hours a day. This information is not intended to replace advice given to you by your health care provider. Make sure you discuss any questions you have with your health care provider. Document Released: 10/25/2015 Document Revised: 07/30/2016 Document Reviewed: 07/30/2016 Elsevier Interactive Patient Education  2019 [employer]. Quality Sleep Information, Adult Quality sleep is important for your mental and physical health. It also improves your quality of life. Quality sleep means you:  Are asleep for most of the time you are in bed.  Fall asleep within 30  minutes.  Wake up no more than once a night.  Are awake for no longer than 20 minutes if you do wake up during the night. Most adults need 7-8 hours of quality sleep each night. How can poor sleep affect me? If you do not get enough quality sleep, you may have:  Mood swings.  Daytime sleepiness.  Confusion.  Decreased reaction time.  Sleep disorders, such as insomnia and sleep apnea.  Difficulty with: ? Solving problems. ? Coping with stress. ? Paying attention. These issues may affect your performance and productivity at work, school, and at home. Lack of sleep may also put you at higher risk for accidents, suicide, and risky behaviors. If you do not get quality sleep you may also be at higher risk for several health problems, including:  Infections.  Type 2 diabetes.  Heart disease.  High blood pressure.  Obesity.  Worsening of long-term conditions, like arthritis, kidney disease, depression, Parkinson's disease, and epilepsy. What actions can I take to get more quality sleep?      Stick to a sleep schedule. Go to sleep and wake up at about the same time each day. Do not try to sleep less on  weekdays and make up for lost sleep on weekends. This does not work.  Try to get about 30 minutes of exercise on most days. Do not exercise 2-3 hours before going to bed.  Limit naps during the day to 30 minutes or less.  Do not use any products that contain nicotine or tobacco, such as cigarettes or e-cigarettes. If you need help quitting, ask your health care provider.  Do not drink caffeinated beverages for at least 8 hours before going to bed. Coffee, tea, and some sodas contain caffeine.  Do not drink alcohol close to bedtime.  Do not eat large meals close to bedtime.  Do not take naps in the late afternoon.  Try to get at least 30 minutes of sunlight every day. Morning sunlight is best.  Make time to relax before bed. Reading, listening to music, or taking a  hot bath promotes quality sleep.  Make your bedroom a place that promotes quality sleep. Keep your bedroom dark, quiet, and at a comfortable room temperature. Make sure your bed is comfortable. Take out sleep distractions like TV, a computer, smartphone, and bright lights.  If you are lying awake in bed for longer than 20 minutes, get up and do a relaxing activity until you feel sleepy.  Work with your health care provider to treat medical conditions that may affect sleeping, such as: ? Nasal obstruction. ? Snoring. ? Sleep apnea and other sleep disorders.  Talk to your health care provider if you think any of your prescription medicines may cause you to have difficulty falling or staying asleep.  If you have sleep problems, talk with a sleep consultant. If you think you have a sleep disorder, talk with your health care provider about getting evaluated by a specialist. Where to find more information  National Sleep Foundation website: https://sleepfoundation.org  National Heart, Lung, and Blood Institute (NHLBI): https://hall.info/www.nhlbi.nih.gov/files/docs/public/sleep/healthy_sleep.pdf  Centers for Disease Control and Prevention (CDC): DetailSports.iswww.cdc.gov/sleep/index.html Contact a health care provider if you:  Have trouble getting to sleep or staying asleep.  Often wake up very early in the morning and cannot get back to sleep.  Have daytime sleepiness.  Have daytime sleep attacks of suddenly falling asleep and sudden muscle weakness (narcolepsy).  Have a tingling sensation in your legs with a strong urge to move your legs (restless legs syndrome).  Stop breathing briefly during sleep (sleep apnea).  Think you have a sleep disorder or are taking a medicine that is affecting your quality of sleep. Summary  Most adults need 7-8 hours of quality sleep each night.  Getting enough quality sleep is an important part of health and well-being.  Make your bedroom a place that promotes quality sleep and  avoid things that may cause you to have poor sleep, such as alcohol, caffeine, smoking, and large meals.  Talk to your health care provider if you have trouble falling asleep or staying asleep. This information is not intended to replace advice given to you by your health care provider. Make sure you discuss any questions you have with your health care provider. Document Released: 02/20/2018 Document Revised: 02/20/2018 Document Reviewed: 02/20/2018 Elsevier Interactive Patient Education  2019 [employer]

## 2019-04-01 NOTE — Progress Notes (Signed)
Subjective:     Patient ID: Pamela Schmidt, female   DOB: 09/28/1986, 33 y.o.   MRN: [id number]  HPI  Here for follow up from missed abortion at 19 weeks with cytotec IOL and vaginal delivery on 03/18/2019. States she has still been bleeding a scant amount daily since then, but denies any pain.  Is not sleeping even with taking tylenol PM. States depression and anxiety are still an issue. Has not been back to work. She and FOB are not in a relationship and have no plans to reconcile. She does have support in family members. And is open to counseling.   GAD 7 : Generalized Anxiety Score 04/01/2019 03/11/2019  Nervous, Anxious, on Edge 2 3  Control/stop worrying 1 3  Worry too much - different things 1 3  Trouble relaxing 2 3  Restless 1 3  Easily annoyed or irritable 2 3  Afraid - awful might happen 0 3  Total GAD 7 Score 9 21  Anxiety Difficulty Very difficult Extremely difficult    [facility] 04/01/2019 03/11/2019  Decreased Interest 1 3  Down, Depressed, Hopeless 1 3  PHQ - 2 Score 2 6  Altered sleeping 3 3  Tired, decreased energy 2 3  Change in appetite 3 2  Feeling bad or failure about yourself  0 3  Trouble concentrating 3 3  Moving slowly or fidgety/restless 1 2  Suicidal thoughts 0 0  PHQ-9 Score 14 22  Difficult doing work/chores Very difficult -   Review of Systems  Constitutional: Positive for appetite change and fatigue.  Psychiatric/Behavioral: Positive for agitation, decreased concentration, dysphoric mood and sleep disturbance. The patient is nervous/anxious.   All other systems reviewed and are negative.      Objective:   Physical Exam A&Ox4 Well groomed female in no distress Blood pressure (!) 153/97, pulse 66, height 5\' 1"  (1.549 m), weight 192 lb (87.1 kg), last menstrual period 11/05/2018, Tearful when discussing delivery and loss of infant.  Psychiatric Specialty Exam: Physical Exam  Review of Systems  Constitutional: Positive for appetite  change and fatigue.  Psychiatric/Behavioral: Positive for agitation, decreased concentration, dysphoric mood and sleep disturbance. The patient is nervous/anxious.   All other systems reviewed and are negative.   Blood pressure (!) 128/92, pulse 66, height 5\' 1"  (1.549 m), weight 192 lb (87.1 kg), last menstrual period 11/05/2018, unknown if currently breastfeeding.Body mass index is 36.28 kg/m.  General Appearance: Casual  Eye Contact:  Good  Speech:  Clear and Coherent and Normal Rate  Volume:  Normal  Mood:  Depressed  Affect:  Congruent and Depressed  Thought Process:  Coherent  Orientation:  Full (Time, Place, and Person)  Thought Content:  Logical  Suicidal Thoughts:  No  Homicidal Thoughts:  No  Memory:  Immediate;   Fair Recent;   Fair Remote;   Good  Judgement:  Fair  Insight:  Present  Psychomotor Activity:  Restlessness  Concentration:  Concentration: Good and Attention Span: Good  Recall:  Good  Fund of Knowledge:  Good  Language:  Good  Akathisia:  Negative  Handed:  Right  AIMS (if indicated):     Assets:  Desire for Improvement Housing Physical Health Resilience Social Support Transportation Vocational/Educational  ADL's:  Intact  Cognition:  WNL  Sleep:         Assessment:     Miscarriage follow up Depression Sleep disturbance Contraception management    Plan:     Will increase zoloft to  100mg  daily, and add ambien 5-10 mg at bedtime. Information on local miscarriage support groups given and number for therapist at [facility] given for patient to set up and appoitment.  Work note given to return to work on 04/22/2019.  Will message me on MyChart in 3 weeks to let me know how the changes in medication are working. Sooner if anything worsens. Desires OCPs for birth control, although she has no plans to be sexually active at this time.  Carnetta Losada,CNM

## 2019-04-01 NOTE — Progress Notes (Signed)
Patient here for follow-up after fetal demise.

## 2019-05-20 ENCOUNTER — Other Ambulatory Visit: Payer: Self-pay

## 2019-05-21 MED ORDER — ZOLPIDEM TARTRATE 10 MG PO TABS: 10.0000 mg | ORAL_TABLET | Freq: Every evening | ORAL | 1 refills | Status: DC | PRN

## 2019-05-21 NOTE — Telephone Encounter (Signed)
pls advise

## 2019-06-25 ENCOUNTER — Other Ambulatory Visit: Payer: Self-pay

## 2019-06-26 ENCOUNTER — Other Ambulatory Visit: Payer: Self-pay | Admitting: Obstetrics and Gynecology

## 2019-06-26 MED ORDER — ZOLPIDEM TARTRATE 10 MG PO TABS: 10.0000 mg | ORAL_TABLET | Freq: Every evening | ORAL | 1 refills | Status: DC | PRN

## 2019-06-26 NOTE — Telephone Encounter (Signed)
pls advise

## 2019-07-01 ENCOUNTER — Other Ambulatory Visit: Payer: Self-pay | Admitting: *Deleted

## 2019-07-01 MED ORDER — SERTRALINE HCL 100 MG PO TABS: 100.0000 mg | ORAL_TABLET | Freq: Every day | ORAL | 4 refills | Status: DC

## 2019-07-01 MED ORDER — ZOLPIDEM TARTRATE 10 MG PO TABS: 10.0000 mg | ORAL_TABLET | Freq: Every evening | ORAL | 1 refills | Status: DC | PRN

## 2019-11-17 ENCOUNTER — Other Ambulatory Visit: Payer: Self-pay | Admitting: Certified Nurse Midwife

## 2019-11-17 MED ORDER — ZOLPIDEM TARTRATE 10 MG PO TABS: 10.0000 mg | ORAL_TABLET | Freq: Every evening | ORAL | 1 refills | Status: DC | PRN

## 2019-11-17 NOTE — Telephone Encounter (Signed)
Its fine to refill the ambien.

## 2019-11-17 NOTE — Progress Notes (Signed)
Order for [facility] , refilled.   Philip Aspen, CNM

## 2020-02-16 ENCOUNTER — Encounter: Payer: Medicaid Other | Admitting: Certified Nurse Midwife

## 2020-02-19 ENCOUNTER — Other Ambulatory Visit: Payer: Self-pay

## 2020-02-19 ENCOUNTER — Ambulatory Visit: Payer: Medicaid Other | Admitting: Family Medicine

## 2020-02-19 ENCOUNTER — Encounter: Payer: Self-pay | Admitting: Family Medicine

## 2020-02-19 VITALS — BP 118/63 | HR 79 | Ht 61.0 in | Wt 215.4 lb

## 2020-02-19 DIAGNOSIS — Z7689 Persons encountering health services in other specified circumstances: Secondary | ICD-10-CM | POA: Diagnosis not present

## 2020-02-19 DIAGNOSIS — S8992XA Unspecified injury of left lower leg, initial encounter: Secondary | ICD-10-CM | POA: Diagnosis not present

## 2020-02-19 DIAGNOSIS — M25562 Pain in left knee: Secondary | ICD-10-CM | POA: Diagnosis not present

## 2020-02-19 DIAGNOSIS — S8392XA Sprain of unspecified site of left knee, initial encounter: Secondary | ICD-10-CM | POA: Diagnosis not present

## 2020-02-19 NOTE — Patient Instructions (Addendum)
As we discussed, I have put in a referral to the orthopedic office for you to have your left knee injury evaluated.  [facility] has a location in [place] that is open today from 1pm-7pm for urgent care orthopedic issues.  [facility] [address] [place], [place] [address] [phone]  Please schedule your physical in 4-6 weeks and we can plan to have labs drawn at that time.  You will receive a survey after today's visit either digitally by e-mail or paper by [employer]. Your experiences and feedback matter to [place].  Please respond so we know how we are doing as we provide care for you.  Call us with any questions/concerns/needs.  It is my goal to be available to you for your health concerns.  Thanks for choosing me to be a partner in your healthcare needs!  Charlaine Dalton, FNP-C Family Nurse Practitioner [facility] Phone: [phone]

## 2020-02-19 NOTE — Progress Notes (Signed)
Subjective:    Patient ID: Pamela Schmidt, female    DOB: [birth year], 34 y.o.   MRN: [id number]  Pamela Schmidt is a 34 y.o. female presenting on 02/19/2020 for Establish Care (pt need a referral for ortho. Recent injury to the left knee x 1 week ago. The pt think it could be injury from working out. Difficulty with walking and standing. )   HPI   Previous PCP was with [facility].  Records will not be requested.  Past medical, family, and surgical history reviewed w/ pt.  Has acute concerns today for left knee pain and needing referral to orthopedics per insurance request.  States believes she had hurt her knee when doing power yoga as there were many different movements with lots of twisting.  Has had progressive loss of range of motion with swelling on the left lateral side with tenderness to palpation.  Has some difficulty with weight bearing and ambulation.  Denies numbness, tingling, weakness.    Depression screen [facility] 2/9 04/01/2019 03/11/2019  Decreased Interest 1 3  Down, Depressed, Hopeless 1 3  PHQ - 2 Score 2 6  Altered sleeping 3 3  Tired, decreased energy 2 3  Change in appetite 3 2  Feeling bad or failure about yourself  0 3  Trouble concentrating 3 3  Moving slowly or fidgety/restless 1 2  Suicidal thoughts 0 0  PHQ-9 Score 14 22  Difficult doing work/chores Very difficult -    Past Medical History:  Diagnosis Date  . Anxiety   . Chicken pox   . History of herpes genitalis    Past Surgical History:  Procedure Laterality Date  . CESAREAN SECTION  2005, 2010  . TONSILLECTOMY     Social History   Socioeconomic History  . Marital status: Divorced    Spouse name: Not on file  . Number of children: 3  . Years of education: 59  . Highest education level: Not on file  Occupational History  . 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 Smoking status: Former Smoker    Quit date: 01/30/2010    Years  since quitting: 10.0  . Smokeless tobacco: Never Used  Substance and Sexual Activity  . Alcohol use: Yes    Alcohol/week: 3.0 - 4.0 standard drinks    Types: 3 - 4 Glasses of wine per week    Comment: occasional  . Drug use: No  . Sexual activity: Yes    Partners: Male    Birth control/protection: None  Other Topics Concern  . Not on file  Social History Narrative   Elaynah was born in [place] and then moved to [place] area when she was in middle school. She lives at home with her children. She works as a Sales executive. She enjoys working outside in the yard and shopping.   Social Determinants of Health   Financial Resource Strain: Medium Risk  . Difficulty of Paying Living Expenses: Somewhat hard  Food Insecurity: No Food Insecurity  . Worried About Programme researcher, broadcasting/film/video in the Last Year: Never true  . Ran Out of Food in the Last Year: Never true  Transportation Needs: No Transportation Needs  . Lack of Transportation (Medical): No  . Lack of Transportation (Non-Medical): No  Physical Activity: Inactive  . Days of Exercise per Week: 0 days  . Minutes of Exercise per Session: 0 min  Stress: Stress Concern Present  . Feeling of Stress : To some extent  Social Connections: Unknown  . Frequency of Communication with Friends and Family: Three times a week  . Frequency of Social Gatherings with Friends and Family: Once a week  . Attends Religious Services: Not on file  . Active Member of Clubs or Organizations: Not on file  . Attends Banker Meetings: Not on file  . Marital Status: Divorced  Catering manager Violence: Not At Risk  . Fear of Current or Ex-Partner: No  . Emotionally Abused: No  . Physically Abused: No  . Sexually Abused: No   Family History  Problem Relation Age of Onset  . Hypertension Mother   . Diverticulitis Mother   . Diabetes Father   . Diverticulitis Maternal Aunt   . Diverticulitis Maternal Grandmother   . Bipolar disorder  Maternal Aunt   . Breast cancer Other   . Ovarian cancer Neg Hx   . Colon cancer Neg Hx    Current Outpatient Medications on File Prior to Visit  Medication Sig  . diphenhydramine-acetaminophen (TYLENOL PM) 25-500 MG TABS tablet Take 1 tablet by mouth at bedtime as needed.  . Ibuprofen (GNP IBUPROFEN) 200 MG CAPS Take 800 capsules by mouth every 8 (eight) hours.  . sertraline (ZOLOFT) 100 MG tablet Take 1 tablet (100 mg total) by mouth daily.  . norethindrone-ethinyl estradiol (JUNEL FE 1/20) 1-20 MG-MCG tablet Take 1 tablet by mouth daily. (Patient not taking: Reported on 02/19/2020)  . zolpidem (AMBIEN) 10 MG tablet Take 1 tablet (10 mg total) by mouth at bedtime as needed for sleep.   No current facility-administered medications on file prior to visit.    Per HPI unless specifically indicated above     Objective:    BP 118/63 (BP Location: Left Arm, Patient Position: Sitting, Cuff Size: Normal)   Pulse 79   Ht 5\' 1"  (1.549 m)   Wt 215 lb 6.4 oz (97.7 kg)   BMI 40.70 kg/m   Wt Readings from Last 3 Encounters:  02/19/20 215 lb 6.4 oz (97.7 kg)  04/01/19 192 lb (87.1 kg)  03/18/19 180 lb (81.6 kg)    Physical Exam Constitutional:      General: She is not in acute distress.    Appearance: Normal appearance. She is obese. She is not ill-appearing or toxic-appearing.  HENT:     Head: Normocephalic.  Eyes:     General: Lids are normal. Vision grossly intact.        Right eye: No discharge.        Left eye: No discharge.     Extraocular Movements: Extraocular movements intact.     Conjunctiva/sclera: Conjunctivae normal.     Pupils: Pupils are equal, round, and reactive to light.  Cardiovascular:     Pulses: Normal pulses.          Dorsalis pedis pulses are 2+ on the right side and 2+ on the left side.       Posterior tibial pulses are 2+ on the right side and 2+ on the left side.  Pulmonary:     Effort: Pulmonary effort is normal.  Musculoskeletal:        General:  Swelling, tenderness and signs of injury present.     Left knee: Swelling present. No deformity, effusion, erythema, ecchymosis, lacerations, bony tenderness or crepitus. Decreased range of motion. Tenderness present. Normal alignment. Normal pulse.     Right lower leg: No edema.  Left lower leg: No edema.     Comments: Tenderness to palpation on lateral side of left knee.  Feet:     Right foot:     Skin integrity: Skin integrity normal.     Left foot:     Skin integrity: Skin integrity normal.  Skin:    General: Skin is warm and dry.     Capillary Refill: Capillary refill takes less than 2 seconds.  Neurological:     General: No focal deficit present.     Mental Status: She is alert and oriented to person, place, and time. Mental status is at baseline.     Cranial Nerves: No cranial nerve deficit.     Sensory: No sensory deficit.     Motor: No weakness.     Coordination: Coordination normal.     Gait: Gait abnormal.     Comments: Antalgic gait  Psychiatric:        Attention and Perception: Attention and perception normal.        Mood and Affect: Mood and affect normal.        Speech: Speech normal.        Behavior: Behavior normal. Behavior is cooperative.        Thought Content: Thought content normal.        Cognition and Memory: Cognition and memory normal.        Judgment: Judgment normal.     Results for orders placed or performed during the hospital encounter of 03/18/19  CBC  Result Value Ref Range   WBC 6.8 4.0 - 10.5 K/uL   RBC 4.49 3.87 - 5.11 MIL/uL   Hemoglobin 13.4 12.0 - 15.0 g/dL   HCT 39.5 36.0 - 46.0 %   MCV 88.0 80.0 - 100.0 fL   MCH 29.8 26.0 - 34.0 pg   MCHC 33.9 30.0 - 36.0 g/dL   RDW 11.9 11.5 - 15.5 %   Platelets 226 150 - 400 K/uL   nRBC 0.0 0.0 - 0.2 %  RPR  Result Value Ref Range   RPR Ser Ql Non Reactive Non Reactive  Rapid HIV screen (HIV 1/2 Ab+Ag) ([facility] Only)  Result Value Ref Range   HIV-1 P24 Antigen - HIV24 NON REACTIVE NON  REACTIVE   HIV 1/2 Antibodies NON REACTIVE NON REACTIVE   Interpretation (HIV Ag Ab)      A non reactive test result means that HIV 1 or HIV 2 antibodies and HIV 1 p24 antigen were not detected in the specimen.  Torch-IgM (toxo/rub/cmv/hsv) w rflx titr  Result Value Ref Range   Rubella IgM <20.0 0.0 - 19.9 AU/mL   Toxoplasma Antibody- IgM <3.0 0.0 - 7.9 AU/mL   CMV IgM <30.0 0.0 - 29.9 AU/mL   HSVI/II Comb IgM 1.63 (H) 0.00 - 0.90 Ratio  Infect disease Ab IgM reflex 1  Result Value Ref Range   Infect disease Ab comment 1 Comment   Type and screen [facility]  Result Value Ref Range   ABO/RH(D) O POS    Antibody Screen NEG    Sample Expiration      03/21/2019 Performed at [facility], [address] [place], [place] [address]       Assessment & Plan:   Problem List Items Addressed This Visit      Other   Left knee pain    Acute left knee pain x1 week with progressive loss of ROM, increased swelling and difficulty with ambulation and weight bearing.  Likely will need an MRI to determine ligament integrity and if there is a tear.  Discussed orthopedic referral and there is an urgent care orthopedic local that is open until 7pm today.  Plan: 1) Referral given to Orthopedics and location for local orthopedics urgent care given 2) To proceed to Orthopedics for evaluation of left knee injury and plan 3) Follow up with [place] as needed      Encounter to establish care with new doctor    Here in clinic as a new patient to establish so she can have referral to Orthopedics, as required by her insurance company.  Plan 1) Referral given to Orthopedics based on exam (see left knee A/P) 2) To return to clinic in 4-6 weeks for physical, lab work and preventative care screenings       Other Visit Diagnoses    Injury of left knee, initial encounter    -  Primary   Relevant Orders   AMB referral to orthopedics      No orders of the defined types were  placed in this encounter.     Follow up plan: Return in about 4 weeks (around 03/18/2020) for Physical exam and labs.  Charlaine Dalton, FNP-C Family Nurse Practitioner [facility] 02/19/2020, 2:06 PM

## 2020-02-19 NOTE — Assessment & Plan Note (Signed)
Acute left knee pain x1 week with progressive loss of ROM, increased swelling and difficulty with ambulation and weight bearing.  Likely will need an MRI to determine ligament integrity and if there is a tear.  Discussed orthopedic referral and there is an urgent care orthopedic local that is open until 7pm today.  Plan: 1) Referral given to Orthopedics and location for local orthopedics urgent care given 2) To proceed to Orthopedics for evaluation of left knee injury and plan 3) Follow up with [place] as needed

## 2020-02-19 NOTE — Assessment & Plan Note (Signed)
Here in clinic as a new patient to establish so she can have referral to Orthopedics, as required by her insurance company.  Plan 1) Referral given to Orthopedics based on exam (see left knee A/P) 2) To return to clinic in 4-6 weeks for physical, lab work and preventative care screenings

## 2020-02-29 ENCOUNTER — Encounter: Payer: Self-pay | Admitting: Family Medicine

## 2020-03-01 ENCOUNTER — Other Ambulatory Visit: Payer: Self-pay | Admitting: Family Medicine

## 2020-03-01 ENCOUNTER — Encounter: Payer: Self-pay | Admitting: Family Medicine

## 2020-03-01 DIAGNOSIS — F419 Anxiety disorder, unspecified: Secondary | ICD-10-CM

## 2020-03-01 MED ORDER — SERTRALINE HCL 100 MG PO TABS: 100.0000 mg | ORAL_TABLET | Freq: Every day | ORAL | 4 refills | Status: DC

## 2020-03-01 NOTE — Progress Notes (Signed)
Refill sent.

## 2020-03-02 ENCOUNTER — Other Ambulatory Visit: Payer: Self-pay | Admitting: Family Medicine

## 2020-03-02 DIAGNOSIS — Z1283 Encounter for screening for malignant neoplasm of skin: Secondary | ICD-10-CM

## 2020-03-02 NOTE — Progress Notes (Signed)
Referral placed to establish with dermatology provider for skin cancer screening.

## 2020-03-19 ENCOUNTER — Ambulatory Visit (INDEPENDENT_AMBULATORY_CARE_PROVIDER_SITE_OTHER): Payer: Medicaid Other | Admitting: Family Medicine

## 2020-03-19 ENCOUNTER — Other Ambulatory Visit: Payer: Self-pay

## 2020-03-19 ENCOUNTER — Encounter: Payer: Self-pay | Admitting: Family Medicine

## 2020-03-19 VITALS — BP 119/74 | HR 73 | Temp 98.2°F | Resp 16 | Ht 61.0 in | Wt 216.8 lb

## 2020-03-19 DIAGNOSIS — Z0000 Encounter for general adult medical examination without abnormal findings: Secondary | ICD-10-CM | POA: Insufficient documentation

## 2020-03-19 DIAGNOSIS — Z6841 Body Mass Index (BMI) 40.0 and over, adult: Secondary | ICD-10-CM | POA: Insufficient documentation

## 2020-03-19 DIAGNOSIS — Z13220 Encounter for screening for lipoid disorders: Secondary | ICD-10-CM

## 2020-03-19 DIAGNOSIS — F419 Anxiety disorder, unspecified: Secondary | ICD-10-CM | POA: Diagnosis not present

## 2020-03-19 DIAGNOSIS — Z1329 Encounter for screening for other suspected endocrine disorder: Secondary | ICD-10-CM | POA: Diagnosis not present

## 2020-03-19 LAB — POCT URINALYSIS DIPSTICK
Bilirubin, UA: NEGATIVE
Glucose, UA: NEGATIVE
Ketones, UA: NEGATIVE
Leukocytes, UA: NEGATIVE
Nitrite, UA: NEGATIVE
Protein, UA: NEGATIVE
Spec Grav, UA: 1.01 (ref 1.010–1.025)
Urobilinogen, UA: NEGATIVE E.U./dL — AB
pH, UA: 5 (ref 5.0–8.0)

## 2020-03-19 MED ORDER — CONTRAVE 8-90 MG PO TB12: ORAL_TABLET | ORAL | 0 refills | Status: DC

## 2020-03-19 MED ORDER — SERTRALINE HCL 100 MG PO TABS: 100.0000 mg | ORAL_TABLET | Freq: Every day | ORAL | 4 refills | Status: DC

## 2020-03-19 NOTE — Assessment & Plan Note (Signed)
Currently stable and well controlled with sertraline 100mg  daily.   Plan: 1. Continue sertraline 100mg  daily 2. Follow up in 3-6 months

## 2020-03-19 NOTE — Assessment & Plan Note (Signed)
Annual physical exam without new findings.  Well adult with no acute concerns.  Plan: 1. Obtain health maintenance screenings as above according to age. - Increase physical activity to 30 minutes most days of the week.  - Eat healthy diet high in vegetables and fruits; low in refined carbohydrates. - Screening labs and tests as ordered 2. Return 1 year for annual physical.  

## 2020-03-19 NOTE — Patient Instructions (Addendum)
As we discussed, have your labs drawn in the next 1-2 weeks and we will contact you with the results.  I have sent in a prescription for Contrave to your pharmacy to help with weight loss.  As we discussed, look into [facility] to see if this is an option that you would like to pursue.  Contact the dermatologist below to schedule your referral [facility] Address: [address] [place], [place], [place] [address] Phone: [phone]  We will plan to see you back in 1 month for weight recheck  You will receive a survey after today's visit either digitally by e-mail or paper by USPS mail. Your experiences and feedback matter to [place].  Please respond so we know how we are doing as we provide care for you.  Call us with any questions/concerns/needs.  It is my goal to be available to you for your health concerns.  Thanks for choosing me to be a partner in your healthcare needs!  Charlaine Dalton, FNP-C Family Nurse Practitioner [facility] Phone: [phone]

## 2020-03-19 NOTE — Assessment & Plan Note (Signed)
Patient requesting to try prescription weight loss medications.  Has tried and failed ally in the past.  Has been working on dietary changes but has limited movement with current left knee injury to starting a structured exercise routine.  Plan: 1. Begin contrave as directed 2. Increase water intake to 100 oz of water per day 3. Follow up in 1 month

## 2020-03-19 NOTE — Progress Notes (Signed)
Subjective:    Patient ID: Pamela Schmidt, female    DOB: 1986-09-05, 34 y.o.   MRN: [id number]  Pamela Schmidt is a 34 y.o. female presenting on 03/19/2020 for Annual Exam   HPI   HEALTH MAINTENANCE:  Weight/BMI: 1lb weight gain since 02/19/2020 Physical activity: No structured exercise routine due to left knee injury Diet: Regular Seatbelt: Yes Sunscreen: Regularly uses tanning salon, is going to establish with Dermatology for skin cancer screenings PAP: Follows with Melody Shambley CNM with [facility] HIV & Hep C Screening: Completed Optometry: Yearly Dentistry: Every 6 months  IMMUNIZATIONS: Influenza: Due next season Tetanus: 11/27/2008 COVID: Not completed   Depression screen [facility] 2/9 03/19/2020 04/01/2019 03/11/2019  Decreased Interest 0 1 3  Down, Depressed, Hopeless 0 1 3  PHQ - 2 Score 0 2 6  Altered sleeping - 3 3  Tired, decreased energy - 2 3  Change in appetite - 3 2  Feeling bad or failure about yourself  - 0 3  Trouble concentrating - 3 3  Moving slowly or fidgety/restless - 1 2  Suicidal thoughts - 0 0  PHQ-9 Score - 14 22  Difficult doing work/chores - Very difficult -    Past Medical History:  Diagnosis Date  . Anxiety   . Chicken pox   . History of herpes genitalis    Past Surgical History:  Procedure Laterality Date  . CESAREAN SECTION  2005, 2010  . TONSILLECTOMY     Social History   Socioeconomic History  . Marital status: Divorced    Spouse name: Not on file  . Number of children: 3  . Years of education: 37  . Highest education level: Not on file  Occupational History  . 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 Smoking status: Former Smoker    Quit date: 01/30/2010    Years since quitting: 10.1  . Smokeless tobacco: Former Engineer, water and Sexual Activity  . Alcohol use: Yes    Alcohol/week: 3.0 - 4.0 standard drinks    Types: 3 - 4 Glasses of wine per week   Comment: occasional  . Drug use: No  . Sexual activity: Yes    Partners: Male    Birth control/protection: None  Other Topics Concern  . Not on file  Social History Narrative   Rahmah was born in [place] and then moved to [place] area when she was in middle school. She lives at home with her children. She works as a Sales executive. She enjoys working outside in the yard and shopping.   Social Determinants of Health   Financial Resource Strain:   . Difficulty of Paying Living Expenses:   Food Insecurity:   . Worried About Programme researcher, broadcasting/film/video in the Last Year:   . Barista in the Last Year:   Transportation Needs:   . Freight forwarder (Medical):   Marland Kitchen Lack of Transportation (Non-Medical):   Physical Activity:   . Days of Exercise per Week:   . Minutes of Exercise per Session:   Stress:   . Feeling of Stress :   Social Connections:   . Frequency of Communication with Friends and Family:   . Frequency of Social Gatherings with Friends and Family:   . Attends Religious Services:   . Active Member of Clubs or Organizations:   . Attends Banker Meetings:   .  Marital Status:   Intimate Partner Violence:   . Fear of Current or Ex-Partner:   . Emotionally Abused:   Marland Kitchen Physically Abused:   . Sexually Abused:    Family History  Problem Relation Age of Onset  . Hypertension Mother   . Diverticulitis Mother   . Diabetes Father   . Diverticulitis Maternal Aunt   . Diverticulitis Maternal Grandmother   . Bipolar disorder Maternal Aunt   . Breast cancer Other   . Ovarian cancer Neg Hx   . Colon cancer Neg Hx    Current Outpatient Medications on File Prior to Visit  Medication Sig  . norethindrone-ethinyl estradiol (JUNEL FE 1/20) 1-20 MG-MCG tablet Take 1 tablet by mouth daily.  . diphenhydramine-acetaminophen (TYLENOL PM) 25-500 MG TABS tablet Take 1 tablet by mouth at bedtime as needed.  . Ibuprofen (GNP IBUPROFEN) 200 MG CAPS Take 800 capsules  by mouth every 8 (eight) hours.  Marland Kitchen zolpidem (AMBIEN) 10 MG tablet Take 1 tablet (10 mg total) by mouth at bedtime as needed for sleep.   No current facility-administered medications on file prior to visit.    Per HPI unless specifically indicated above     Objective:    BP 119/74   Pulse 73   Temp 98.2 F (36.8 C) (Temporal)   Resp 16   Ht 5\' 1"  (1.549 m)   Wt 216 lb 12.8 oz (98.3 kg)   SpO2 100%   BMI 40.96 kg/m   Wt Readings from Last 3 Encounters:  03/19/20 216 lb 12.8 oz (98.3 kg)  02/19/20 215 lb 6.4 oz (97.7 kg)  04/01/19 192 lb (87.1 kg)    Physical Exam Vitals reviewed.  Constitutional:      General: She is not in acute distress.    Appearance: Normal appearance. She is well-developed and well-groomed. She is obese. She is not ill-appearing or toxic-appearing.  HENT:     Head: Normocephalic.  Eyes:     General: Lids are normal. Vision grossly intact.        Right eye: No discharge.        Left eye: No discharge.     Extraocular Movements: Extraocular movements intact.     Conjunctiva/sclera: Conjunctivae normal.     Pupils: Pupils are equal, round, and reactive to light.  Cardiovascular:     Rate and Rhythm: Normal rate and regular rhythm.     Pulses: Normal pulses.          Dorsalis pedis pulses are 2+ on the right side and 2+ on the left side.       Posterior tibial pulses are 2+ on the right side and 2+ on the left side.     Heart sounds: Normal heart sounds. No murmur. No friction rub. No gallop.   Pulmonary:     Effort: Pulmonary effort is normal. No respiratory distress.     Breath sounds: Normal breath sounds.  Abdominal:     General: Abdomen is flat. Bowel sounds are normal. There is no distension.     Palpations: Abdomen is soft.  Musculoskeletal:     Right lower leg: No edema.     Left lower leg: No edema.  Feet:     Right foot:     Skin integrity: Skin integrity normal.     Left foot:     Skin integrity: Skin integrity normal.  Skin:     General: Skin is warm and dry.     Capillary Refill: Capillary refill takes  less than 2 seconds.  Neurological:     General: No focal deficit present.     Mental Status: She is alert and oriented to person, place, and time.     Cranial Nerves: No cranial nerve deficit.     Sensory: No sensory deficit.     Motor: No weakness.     Coordination: Coordination normal.     Gait: Gait normal.  Psychiatric:        Attention and Perception: Attention and perception normal.        Mood and Affect: Mood and affect normal.        Speech: Speech normal.        Behavior: Behavior normal. Behavior is cooperative.        Thought Content: Thought content normal.        Cognition and Memory: Cognition and memory normal.        Judgment: Judgment normal.     Results for orders placed or performed in visit on 03/19/20  POCT Urinalysis Dipstick  Result Value Ref Range   Color, UA [place]    Clarity, UA clear    Glucose, UA Negative Negative   Bilirubin, UA Negative    Ketones, UA Negative    Spec Grav, UA 1.010 1.010 - 1.025   Blood, UA trace    pH, UA 5.0 5.0 - 8.0   Protein, UA Negative Negative   Urobilinogen, UA negative (A) 0.2 or 1.0 E.U./dL   Nitrite, UA Negative    Leukocytes, UA Negative Negative   Appearance     Odor        Assessment & Plan:   Problem List Items Addressed This Visit      Other   Anxiety    Currently stable and well controlled with sertraline 100mg  daily.   Plan: 1. Continue sertraline 100mg  daily 2. Follow up in 3-6 months      Relevant Medications   sertraline (ZOLOFT) 100 MG tablet   Routine medical exam - Primary    Annual physical exam without new findings.  Well adult with no acute concerns.  Plan: 1. Obtain health maintenance screenings as above according to age. - Increase physical activity to 30 minutes most days of the week.  - Eat healthy diet high in vegetables and fruits; low in refined carbohydrates. - Screening labs and tests as  ordered 2. Return 1 year for annual physical.       Relevant Orders   CBC with Differential   COMPLETE METABOLIC PANEL WITH GFR   Lipid Profile   Thyroid Panel With TSH   POCT Urinalysis Dipstick (Completed)   BMI 40.0-44.9, adult [facility])    Patient requesting to try prescription weight loss medications.  Has tried and failed ally in the past.  Has been working on dietary changes but has limited movement with current left knee injury to starting a structured exercise routine.  Plan: 1. Begin contrave as directed 2. Increase water intake to 100 oz of water per day 3. Follow up in 1 month      Relevant Medications   Naltrexone-buPROPion HCl ER (CONTRAVE) 8-90 MG TB12    Other Visit Diagnoses    Screening for thyroid disorder       Relevant Orders   Thyroid Panel With TSH   Screening for lipid disorders       Relevant Orders   Lipid Profile   Morbid obesity (HCC)       Relevant Medications   Naltrexone-buPROPion HCl  ER (CONTRAVE) 8-90 MG TB12      Meds ordered this encounter  Medications  . Naltrexone-buPROPion HCl ER (CONTRAVE) 8-90 MG TB12    Sig: Start 1 tablet every morning for 7 days, then 1 tablet twice daily for 7 days, then 2 tablets every morning and one every evening    Dispense:  120 tablet    Refill:  0  . sertraline (ZOLOFT) 100 MG tablet    Sig: Take 1 tablet (100 mg total) by mouth daily.    Dispense:  90 tablet    Refill:  4      Follow up plan: Return in about 4 weeks (around 04/16/2020) for Weight loss med recheck.  Charlaine Dalton, FNP-C Family Nurse Practitioner [facility] 03/19/2020, 8:33 AM

## 2020-03-20 LAB — COMPLETE METABOLIC PANEL WITH GFR
AG Ratio: 1.5 (calc) (ref 1.0–2.5)
ALT: 11 U/L (ref 6–29)
AST: 11 U/L (ref 10–30)
Albumin: 4 g/dL (ref 3.6–5.1)
Alkaline phosphatase (APISO): 66 U/L (ref 31–125)
BUN: 18 mg/dL (ref 7–25)
CO2: 27 mmol/L (ref 20–32)
Calcium: 8.9 mg/dL (ref 8.6–10.2)
Chloride: 105 mmol/L (ref 98–110)
Creat: 0.67 mg/dL (ref 0.50–1.10)
GFR, Est African American: 131 mL/min/{1.73_m2} (ref >=60)
GFR, Est Non African American: 113 mL/min/{1.73_m2} (ref >=60)
Globulin: 2.6 g/dL (calc) (ref 1.9–3.7)
Glucose, Bld: 68 mg/dL (ref 65–99)
Potassium: 4.1 mmol/L (ref 3.5–5.3)
Sodium: 138 mmol/L (ref 135–146)
Total Bilirubin: 0.5 mg/dL (ref 0.2–1.2)
Total Protein: 6.6 g/dL (ref 6.1–8.1)

## 2020-03-20 LAB — CBC WITH DIFFERENTIAL/PLATELET
Absolute Monocytes: 638 cells/uL (ref 200–950)
Basophils Absolute: 52 cells/uL (ref 0–200)
Basophils Relative: 0.9 %
Eosinophils Absolute: 209 cells/uL (ref 15–500)
Eosinophils Relative: 3.6 %
HCT: 41.9 % (ref 35.0–45.0)
Hemoglobin: 13.9 g/dL (ref 11.7–15.5)
Lymphs Abs: 1531 cells/uL (ref 850–3900)
MCH: 29.7 pg (ref 27.0–33.0)
MCHC: 33.2 g/dL (ref 32.0–36.0)
MCV: 89.5 fL (ref 80.0–100.0)
MPV: 11.5 fL (ref 7.5–12.5)
Monocytes Relative: 11 %
Neutro Abs: 3370 cells/uL (ref 1500–7800)
Neutrophils Relative %: 58.1 %
Platelets: 241 10*3/uL (ref 140–400)
RBC: 4.68 10*6/uL (ref 3.80–5.10)
RDW: 11.8 % (ref 11.0–15.0)
Total Lymphocyte: 26.4 %
WBC: 5.8 10*3/uL (ref 3.8–10.8)

## 2020-03-20 LAB — LIPID PANEL
Cholesterol: 180 mg/dL (ref <200)
HDL: 75 mg/dL (ref >=50)
LDL Cholesterol (Calc): 90 mg/dL (calc)
Non-HDL Cholesterol (Calc): 105 mg/dL (calc) (ref <130)
Total CHOL/HDL Ratio: 2.4 (calc) (ref <5.0)
Triglycerides: 64 mg/dL (ref <150)

## 2020-03-20 LAB — THYROID PANEL WITH TSH
Free Thyroxine Index: 1.7 (ref 1.4–3.8)
T3 Uptake: 29 % (ref 22–35)
T4, Total: 5.9 ug/dL (ref 5.1–11.9)
TSH: 3.67 mIU/L

## 2020-03-23 ENCOUNTER — Encounter: Payer: Self-pay | Admitting: Family Medicine

## 2020-03-26 ENCOUNTER — Encounter: Payer: Medicaid Other | Admitting: Certified Nurse Midwife

## 2020-04-12 ENCOUNTER — Telehealth: Payer: Self-pay | Admitting: Certified Nurse Midwife

## 2020-04-12 NOTE — Telephone Encounter (Signed)
Called pt lmtrc for  Reschedule annual

## 2020-04-16 ENCOUNTER — Ambulatory Visit: Payer: Medicaid Other | Admitting: Family Medicine

## 2020-05-28 ENCOUNTER — Encounter: Payer: Self-pay | Admitting: Family Medicine

## 2020-06-14 ENCOUNTER — Encounter: Payer: Medicaid Other | Admitting: Dermatology

## 2020-07-06 ENCOUNTER — Encounter: Payer: Self-pay | Admitting: Family Medicine

## 2020-08-03 ENCOUNTER — Encounter: Payer: Self-pay | Admitting: Family Medicine

## 2020-08-04 ENCOUNTER — Other Ambulatory Visit: Payer: Self-pay | Admitting: Family Medicine

## 2020-08-04 DIAGNOSIS — F419 Anxiety disorder, unspecified: Secondary | ICD-10-CM

## 2020-08-04 MED ORDER — SERTRALINE HCL 100 MG PO TABS: 100.0000 mg | ORAL_TABLET | Freq: Every day | ORAL | 1 refills | Status: DC

## 2020-09-06 ENCOUNTER — Ambulatory Visit: Payer: Medicaid Other | Admitting: Family Medicine

## 2020-09-07 ENCOUNTER — Ambulatory Visit: Payer: Medicaid Other | Admitting: Family Medicine

## 2020-11-24 ENCOUNTER — Other Ambulatory Visit: Payer: Self-pay

## 2020-11-24 ENCOUNTER — Encounter: Payer: Self-pay | Admitting: Family Medicine

## 2020-11-24 ENCOUNTER — Telehealth (INDEPENDENT_AMBULATORY_CARE_PROVIDER_SITE_OTHER): Payer: Medicaid Other | Admitting: Family Medicine

## 2020-11-24 DIAGNOSIS — J019 Acute sinusitis, unspecified: Secondary | ICD-10-CM | POA: Insufficient documentation

## 2020-11-24 MED ORDER — AZELASTINE HCL 0.1 % NA SOLN: 2.0000 | Freq: Two times a day (BID) | NASAL | 12 refills | Status: DC

## 2020-11-24 MED ORDER — DOXYCYCLINE HYCLATE 100 MG PO TABS: 100.0000 mg | ORAL_TABLET | Freq: Two times a day (BID) | ORAL | 0 refills | Status: DC

## 2020-11-24 MED ORDER — PROMETHAZINE-DM 6.25-15 MG/5ML PO SYRP
5.0000 mL | ORAL_SOLUTION | Freq: Four times a day (QID) | ORAL | 0 refills | Status: DC | PRN
Start: 2020-11-24 — End: 2022-03-31

## 2020-11-24 MED ORDER — GUAIFENESIN ER 600 MG PO TB12: 1200.0000 mg | ORAL_TABLET | Freq: Two times a day (BID) | ORAL | 0 refills | Status: AC

## 2020-11-24 NOTE — Progress Notes (Signed)
Virtual Visit via Telephone  The purpose of this virtual visit is to provide medical care while limiting exposure to the novel coronavirus (COVID19) for both patient and office staff.  Consent was obtained for phone visit:  Yes.   Answered questions that patient had about telehealth interaction:  Yes.   I discussed the limitations, risks, security and privacy concerns of performing an evaluation and management service by telephone. I also discussed with the patient that there may be a patient responsible charge related to this service. The patient expressed understanding and agreed to proceed.  Patient is at home and is accessed via telephone Services are provided by Charlaine Dalton, FNP-C from [facility])  ---------------------------------------------------------------------- Chief Complaint  Patient presents with  . Sinus Problem    Frontal facial pressure, coughing up greenish phlegm, facial pressure and blowing out greenish color mucus and nasal congestion x 1 week     S: Reviewed CMA documentation. I have called patient and gathered additional HPI as follows:  Pamela Schmidt presents for virtual telemedicine visit via telephone with concerns for sinus pressure and congestion, with purulent drainage from nose, and coughing up green mucus x 1 week.  Reports history of chronic sinus infections in the past and typically they clear up with over the counter treatment.  Denies fevers, sore throat, change in taste/smell, SOB, CP, abdominal pain, n/v/d.  Patient is currently home Denies any high risk travel to areas of current concern for COVID19. Denies any known or suspected exposure to person with or possibly with COVID19.  Past Medical History:  Diagnosis Date  . Anxiety   . Chicken pox   . History of herpes genitalis    Social History   Tobacco Use  . Smoking status: Former Smoker    Quit date: 01/30/2010    Years since quitting: 10.8  . Smokeless tobacco:  Never Used  Vaping Use  . Vaping Use: Never used  Substance Use Topics  . Alcohol use: Yes    Alcohol/week: 2.0 standard drinks    Types: 2 Glasses of wine per week    Comment: occasional  . Drug use: No    Current Outpatient Medications:  .  azelastine (ASTELIN) 0.1 % nasal spray, Place 2 sprays into both nostrils 2 (two) times daily. Use in each nostril as directed, Disp: 30 mL, Rfl: 12 .  diphenhydramine-acetaminophen (TYLENOL PM) 25-500 MG TABS tablet, Take 1 tablet by mouth at bedtime as needed., Disp: , Rfl:  .  doxycycline (VIBRA-TABS) 100 MG tablet, Take 1 tablet (100 mg total) by mouth 2 (two) times daily., Disp: 20 tablet, Rfl: 0 .  guaiFENesin (MUCINEX) 600 MG 12 hr tablet, Take 2 tablets (1,200 mg total) by mouth 2 (two) times daily for 10 days., Disp: 40 tablet, Rfl: 0 .  Ibuprofen 200 MG CAPS, Take 800 capsules by mouth every 8 (eight) hours., Disp: , Rfl:  .  promethazine-dextromethorphan (PROMETHAZINE-DM) 6.25-15 MG/5ML syrup, Take 5 mLs by mouth 4 (four) times daily as needed for cough., Disp: 118 mL, Rfl: 0 .  sertraline (ZOLOFT) 100 MG tablet, Take 1 tablet (100 mg total) by mouth daily., Disp: 90 tablet, Rfl: 1 .  Naltrexone-buPROPion HCl ER (CONTRAVE) 8-90 MG TB12, Start 1 tablet every morning for 7 days, then 1 tablet twice daily for 7 days, then 2 tablets every morning and one every evening (Patient not taking: Reported on 11/24/2020), Disp: 120 tablet, Rfl: 0 .  norethindrone-ethinyl estradiol (JUNEL FE 1/20) 1-20 MG-MCG tablet,  Take 1 tablet by mouth daily. (Patient not taking: Reported on 11/24/2020), Disp: 3 Package, Rfl: 4  Depression screen [facility] 2/9 03/19/2020 04/01/2019 03/11/2019  Decreased Interest 0 1 3  Down, Depressed, Hopeless 0 1 3  PHQ - 2 Score 0 2 6  Altered sleeping - 3 3  Tired, decreased energy - 2 3  Change in appetite - 3 2  Feeling bad or failure about yourself  - 0 3  Trouble concentrating - 3 3  Moving slowly or fidgety/restless - 1 2   Suicidal thoughts - 0 0  PHQ-9 Score - 14 22  Difficult doing work/chores - Very difficult -    GAD 7 : Generalized Anxiety Score 04/01/2019 03/11/2019  Nervous, Anxious, on Edge 2 3  Control/stop worrying 1 3  Worry too much - different things 1 3  Trouble relaxing 2 3  Restless 1 3  Easily annoyed or irritable 2 3  Afraid - awful might happen 0 3  Total GAD 7 Score 9 21  Anxiety Difficulty Very difficult Extremely difficult    -------------------------------------------------------------------------- O: No physical exam performed due to remote telephone encounter.  Physical Exam: Patient remotely monitored without video.  Verbal communication appropriate.  Cognition normal.  No results found for this or any previous visit (from the past 2160 hour(s)).  -------------------------------------------------------------------------- A&P:  Problem List Items Addressed This Visit      Respiratory   Acute non-recurrent sinusitis - Primary    Sinus infection based on symptoms.  Will treat with doxycycline 100mg  BID x 10 days, mucinex 1200mg  BID x 10 days, azelastine nasal spray 2 sprays into each nostril daily and promethazine DM 79mL every 6 hours as needed for cough/nasal congestion.  To RTC if symptoms worsen or fail to improve.      Relevant Medications   doxycycline (VIBRA-TABS) 100 MG tablet   guaiFENesin (MUCINEX) 600 MG 12 hr tablet   azelastine (ASTELIN) 0.1 % nasal spray   promethazine-dextromethorphan (PROMETHAZINE-DM) 6.25-15 MG/5ML syrup      Meds ordered this encounter  Medications  . doxycycline (VIBRA-TABS) 100 MG tablet    Sig: Take 1 tablet (100 mg total) by mouth 2 (two) times daily.    Dispense:  20 tablet    Refill:  0  . guaiFENesin (MUCINEX) 600 MG 12 hr tablet    Sig: Take 2 tablets (1,200 mg total) by mouth 2 (two) times daily for 10 days.    Dispense:  40 tablet    Refill:  0  . azelastine (ASTELIN) 0.1 % nasal spray    Sig: Place 2 sprays into  both nostrils 2 (two) times daily. Use in each nostril as directed    Dispense:  30 mL    Refill:  12  . promethazine-dextromethorphan (PROMETHAZINE-DM) 6.25-15 MG/5ML syrup    Sig: Take 5 mLs by mouth 4 (four) times daily as needed for cough.    Dispense:  118 mL    Refill:  0    Follow-up: - Return if symptoms worsen or fail to improve  Patient verbalizes understanding with the above medical recommendations including the limitation of remote medical advice.  Specific follow-up and call-back criteria were given for patient to follow-up or seek medical care more urgently if needed.  - Time spent in direct consultation with patient on phone: 8 minutes  01-29-1991, FNP-C [facility] 11/24/2020, 2:13 PM

## 2020-11-24 NOTE — Assessment & Plan Note (Signed)
Sinus infection based on symptoms.  Will treat with doxycycline 100mg  BID x 10 days, mucinex 1200mg  BID x 10 days, azelastine nasal spray 2 sprays into each nostril daily and promethazine DM 30mL every 6 hours as needed for cough/nasal congestion.  To RTC if symptoms worsen or fail to improve.

## 2020-11-28 IMAGING — US US OB COMP LESS 14 WK
1 series · 14 of 28 positions shown · non-contrast
Comparison: None.

CLINICAL DATA: Pregnant patient with bleeding and cramping since
last night.

EXAM:
OBSTETRIC <14 WK US AND TRANSVAGINAL OB US
TECHNIQUE: Both transabdominal and transvaginal ultrasound examinations were
performed for complete evaluation of the gestation as well as the
maternal uterus, adnexal regions, and pelvic cul-de-sac.
Transvaginal technique was performed to assess early pregnancy.

[Series 1: us ob comp less 14 wk · 14 of 125 slices shown]
[im 5/125]
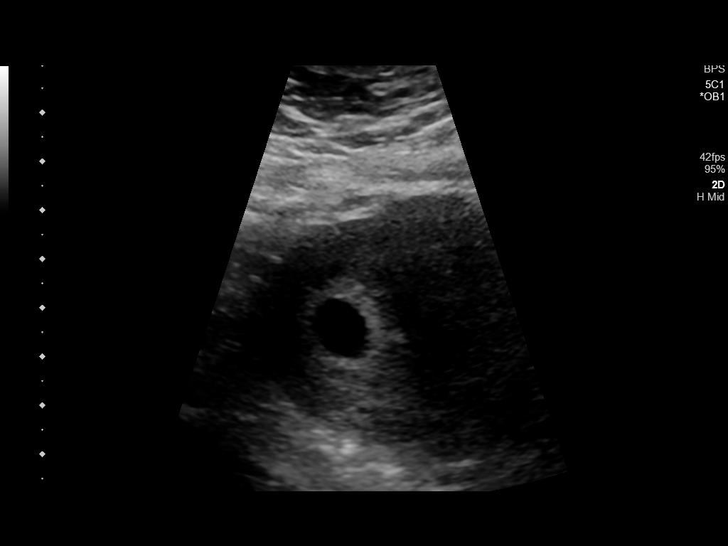
[im 14/125]
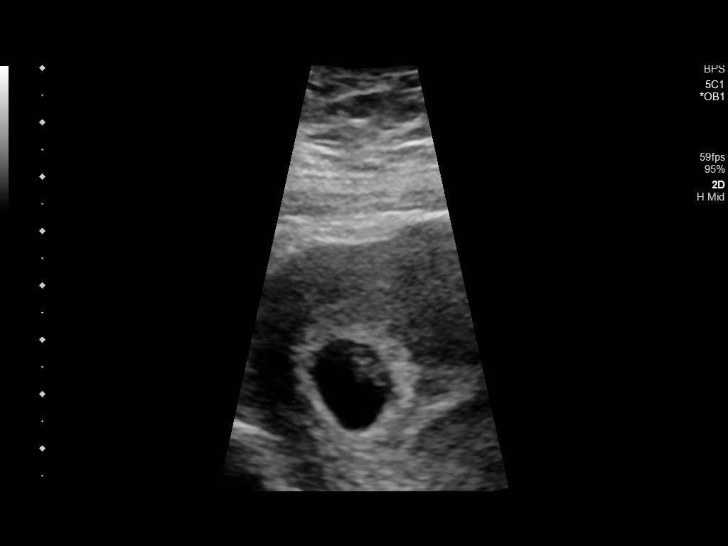
[im 23/125]
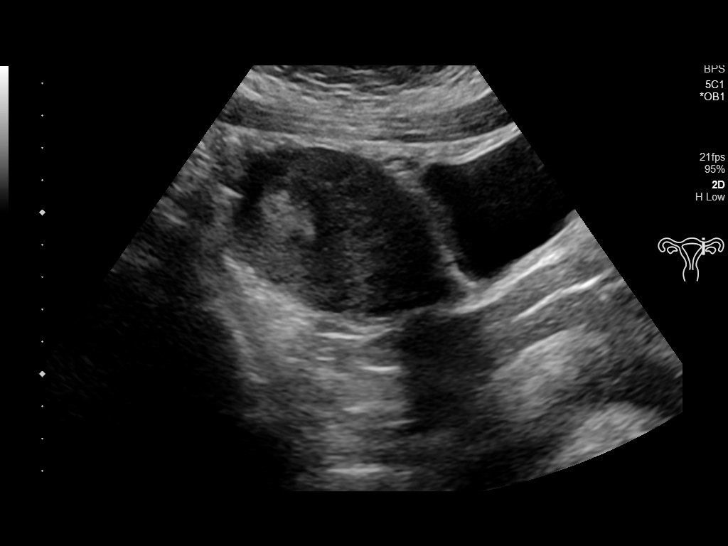
[im 33/125]
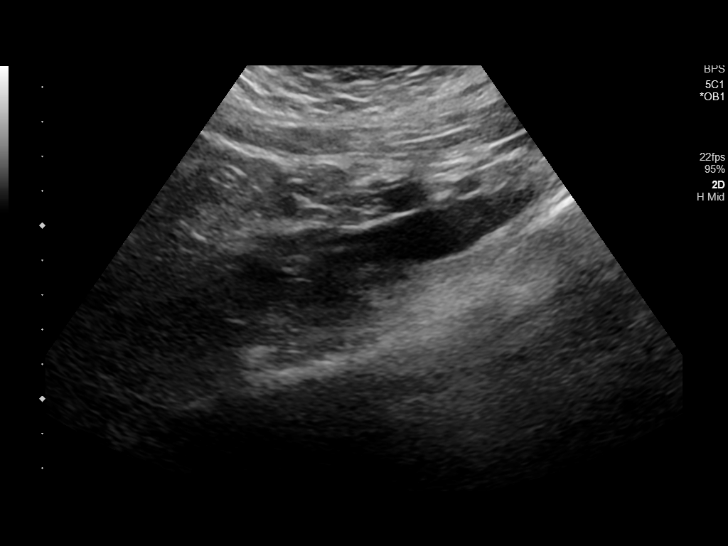
[im 42/125]
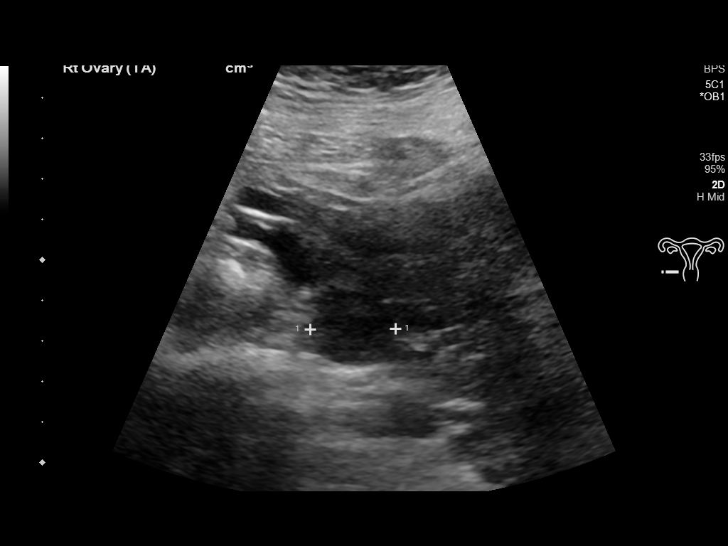
[im 51/125]
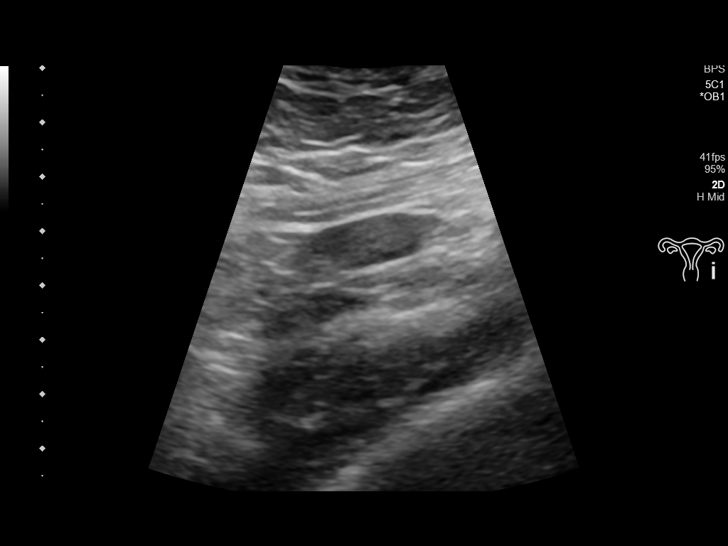
[im 60/125]
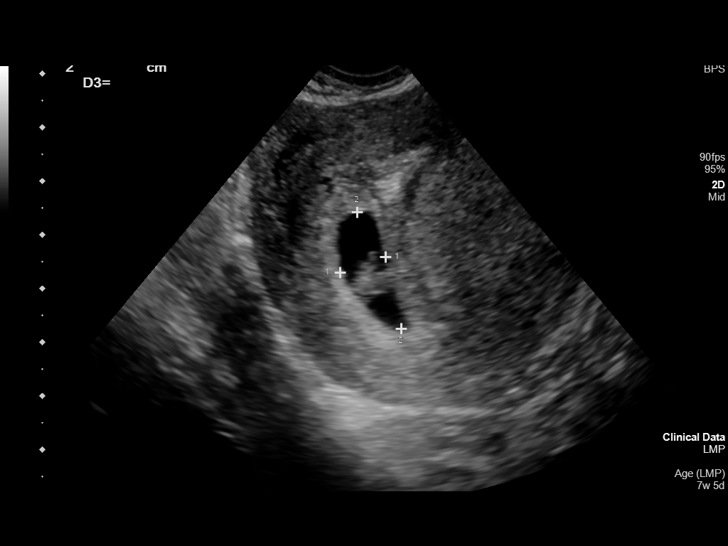
[im 69/125]
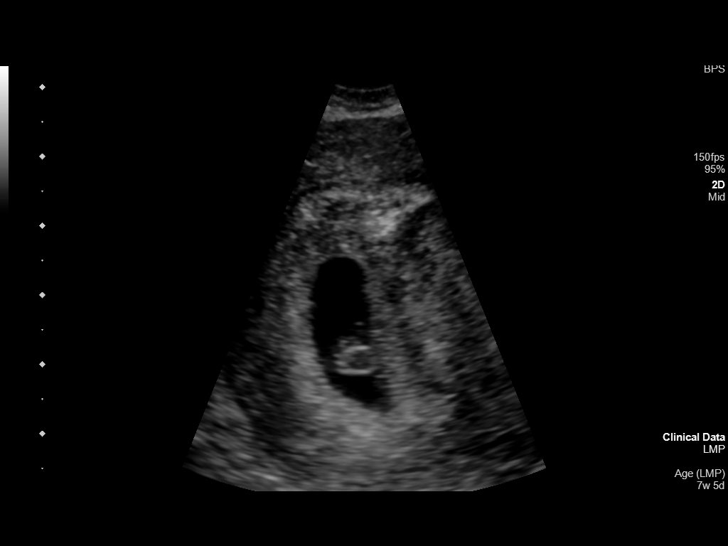
[im 79/125]
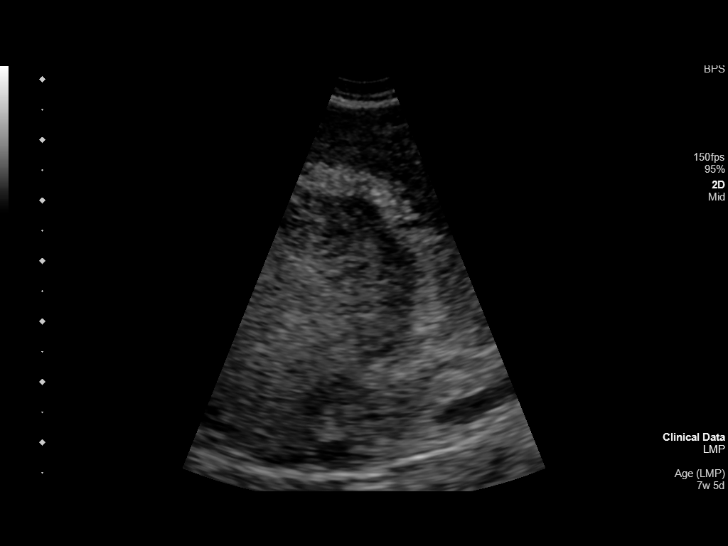
[im 88/125]
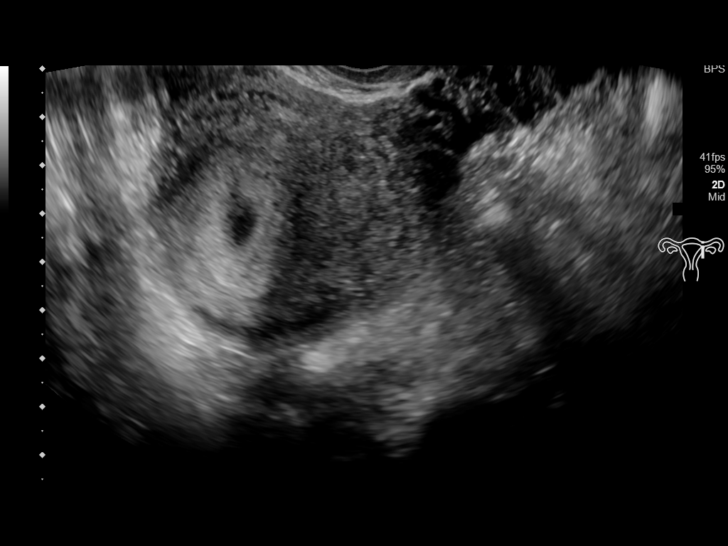
[im 97/125]
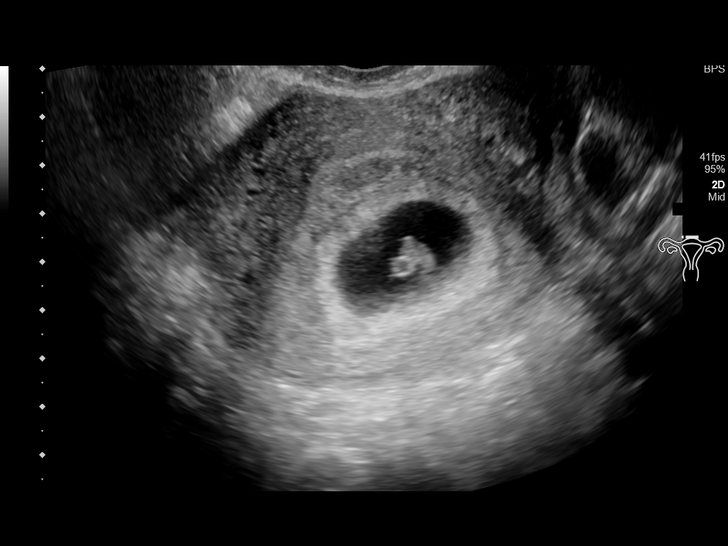
[im 106/125]
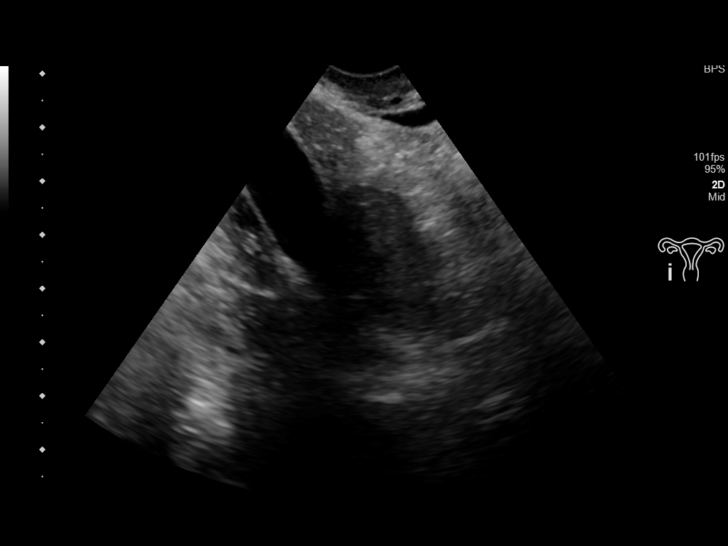
[im 115/125]
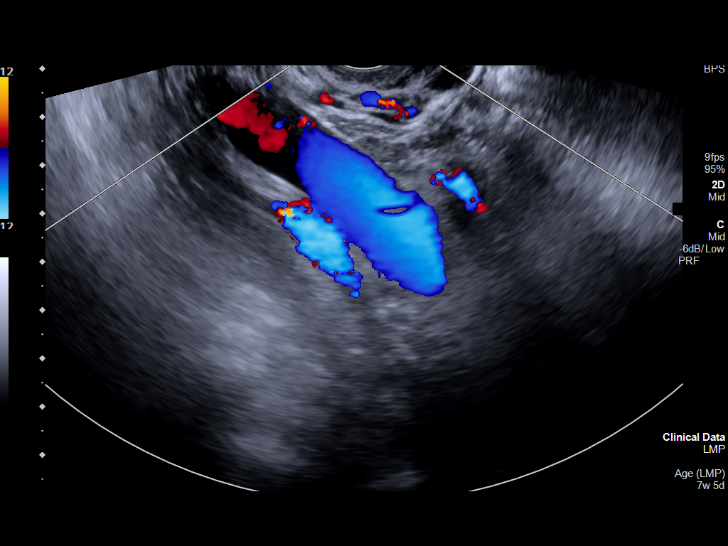
[im 125/125]
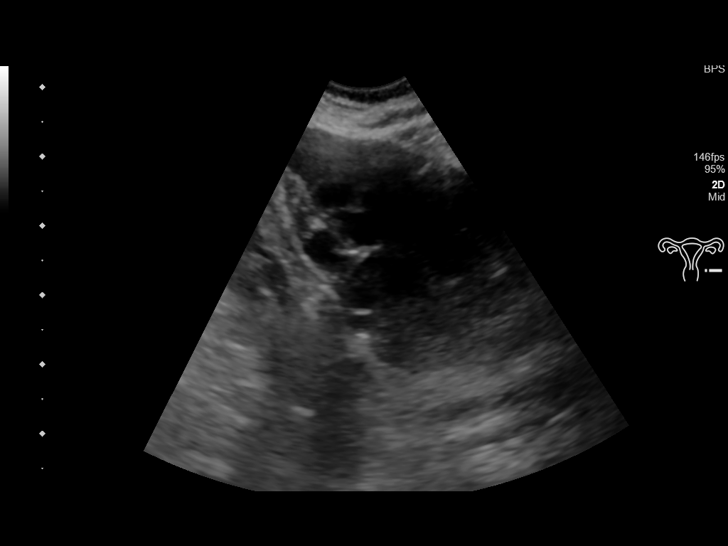

[14 of 28 positions shown; findings below may reference images not displayed]

FINDINGS: Intrauterine gestational sac: Single.

Yolk sac:  Visualized.

Embryo:  Visualized.

Cardiac Activity: Detected.

Heart Rate: 139 bpm

CRL:  8.7 mm   6 w   6 d                  US EDC: 08/18/2019

Subchorionic hemorrhage:  None visualized.

Maternal uterus/adnexae: Negative.
IMPRESSION: Single living intrauterine pregnancy.  No acute finding.

## 2021-02-14 IMAGING — US LIMITED OBSTETRIC ULTRASOUND
1 series · 14 of 16 positions shown · non-contrast
Comparison: none

CLINICAL DATA: Pregnant patient.  No fetal movement for 4 days.

EXAM:
LIMITED OBSTETRIC ULTRASOUND

[Series 1: limited obstetric ultrasound · 16 acquisitions, 14 frames shown]
[im 1/16]
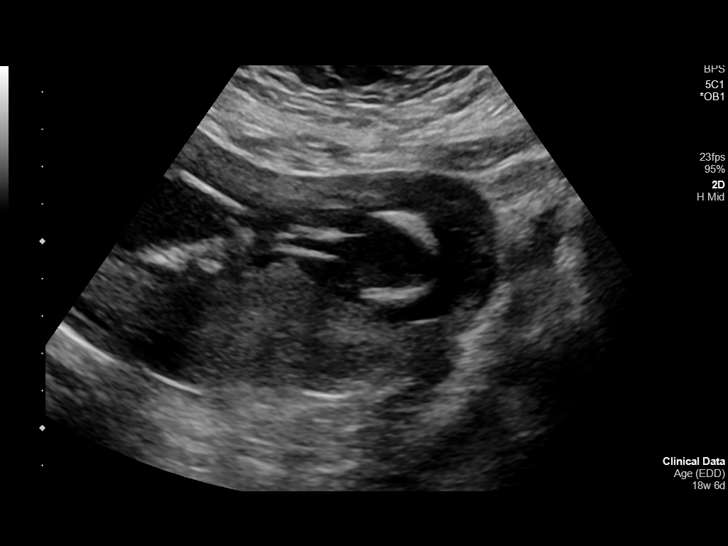
[im 2/16]
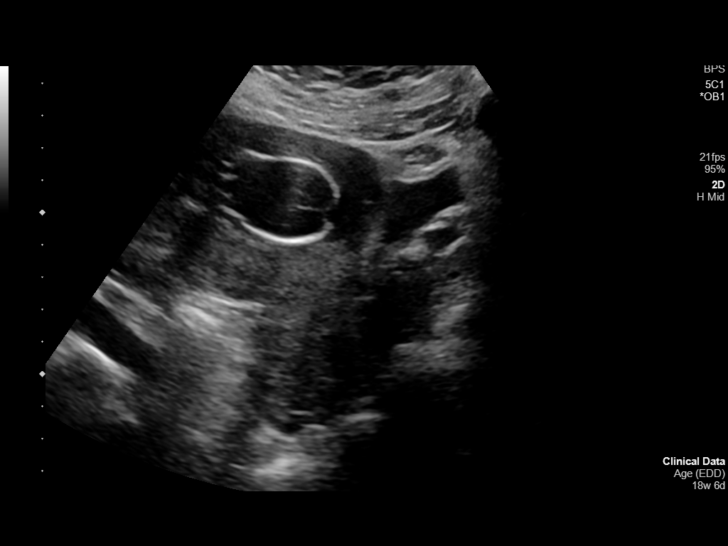
[im 3/16]
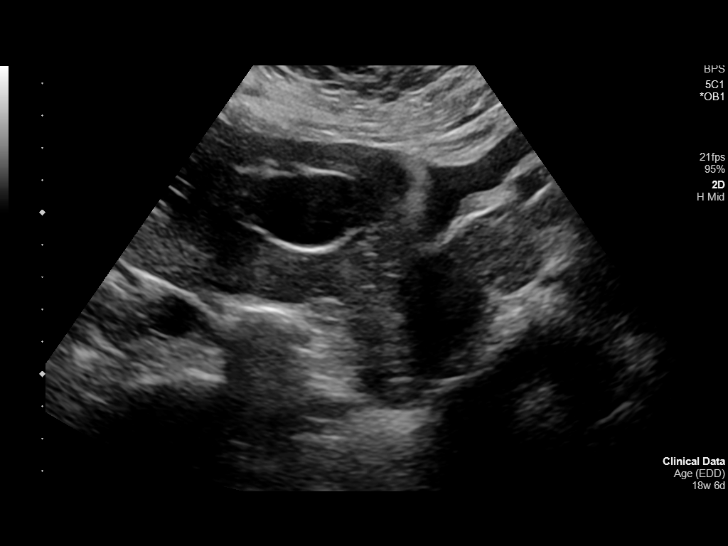
[im 5/16]
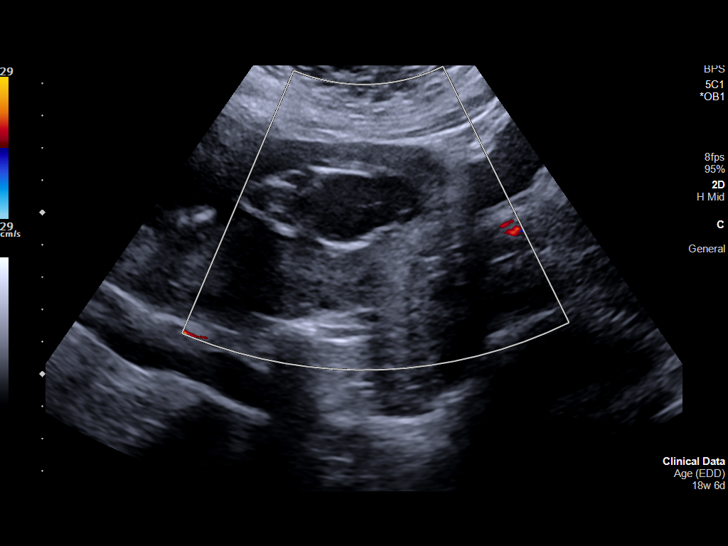
[im 6/16]
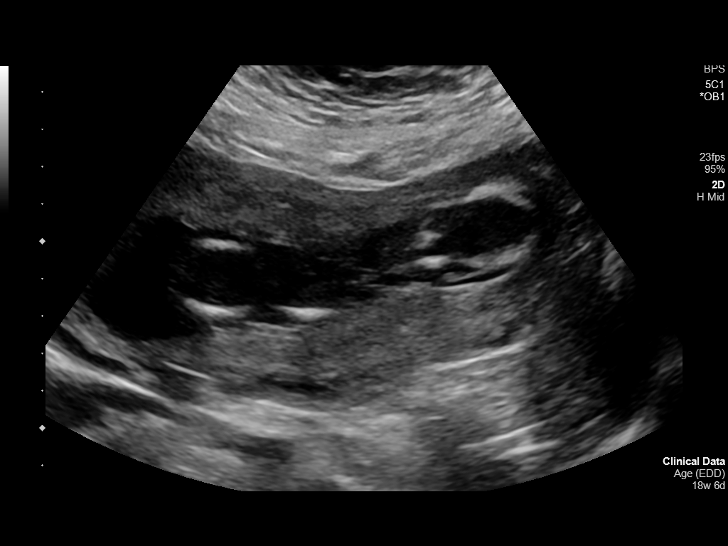
[im 7/16]
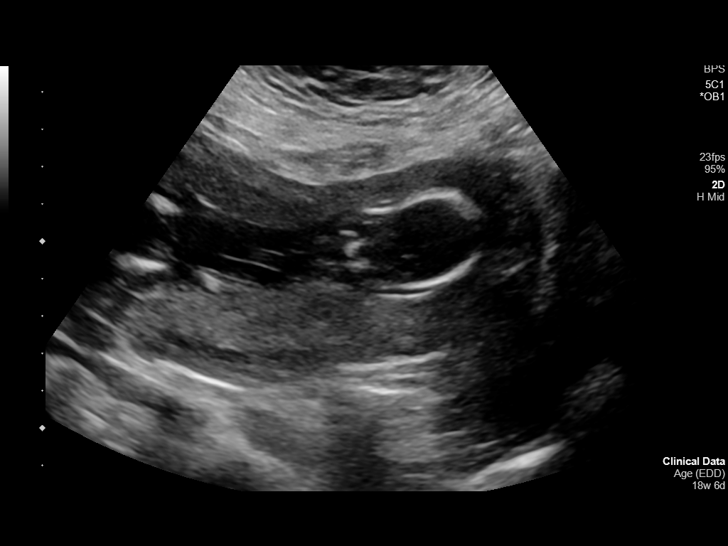
[im 8/16]
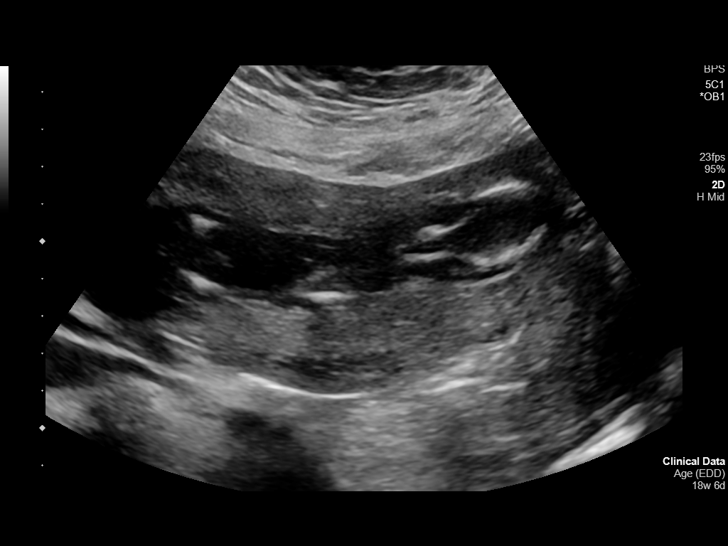
[im 9/16]
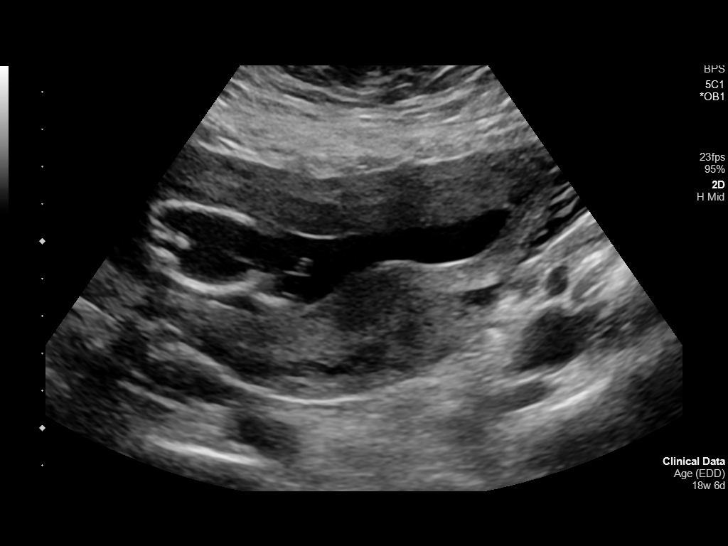
[im 10/16]
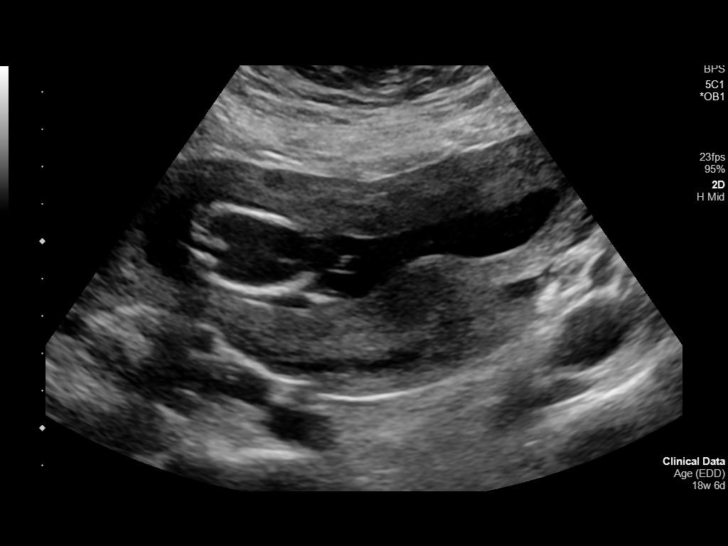
[im 11/16]
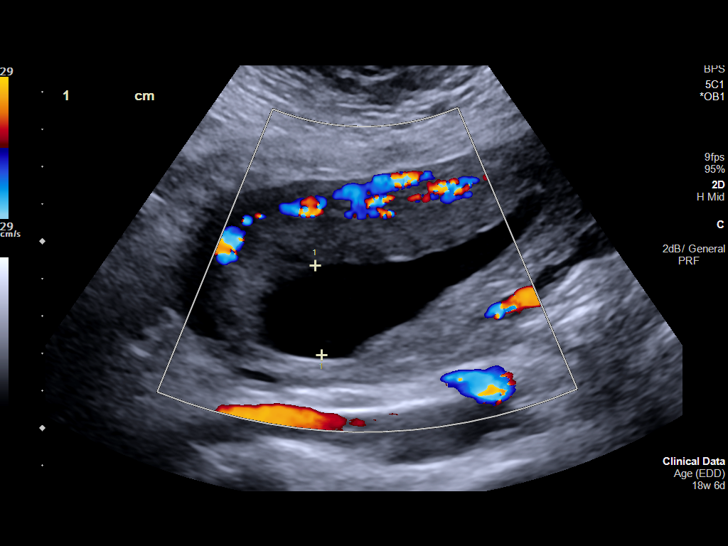
[im 13/16]
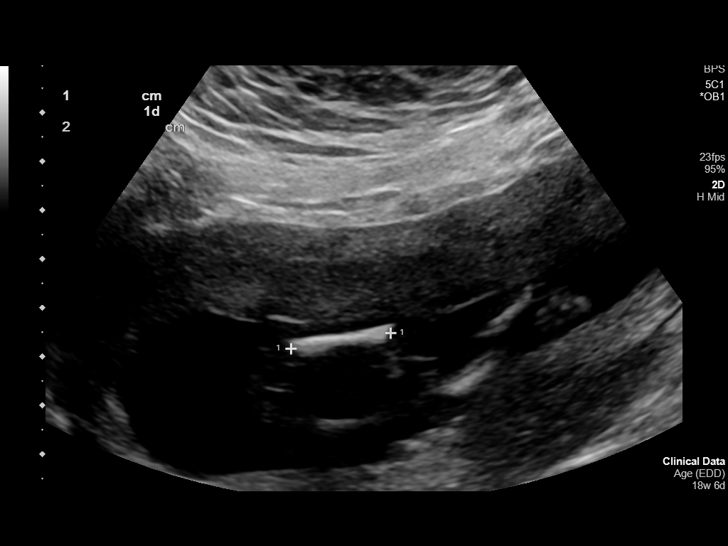
[im 14/16]
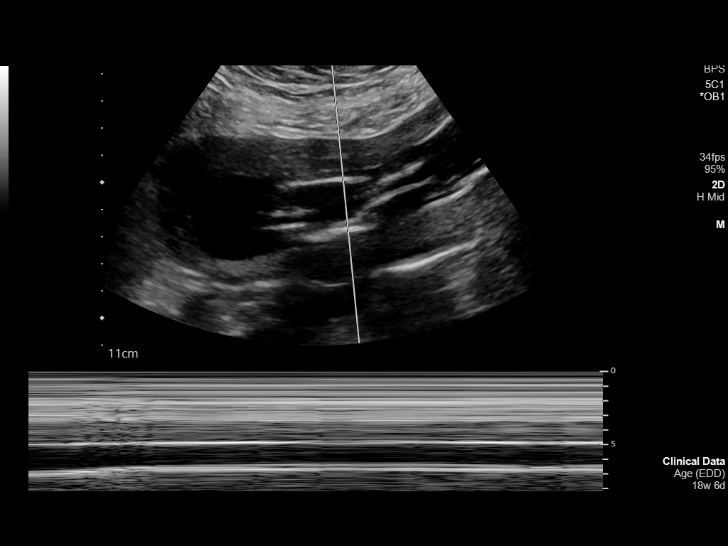
[im 15/16]
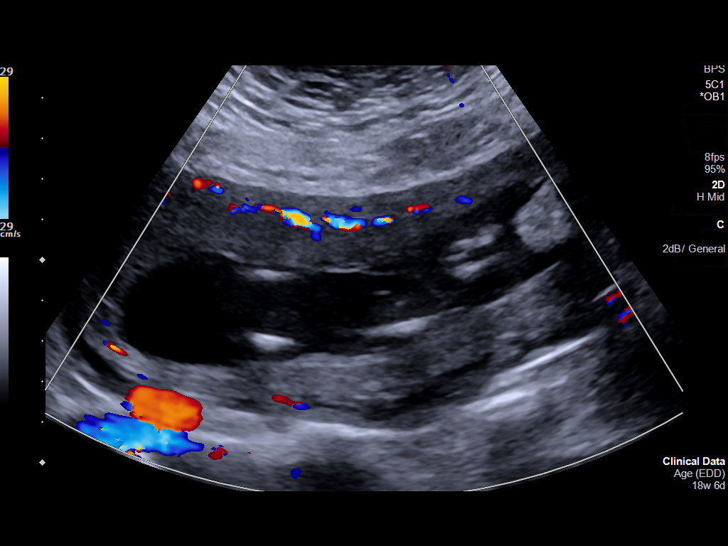
[im 16/16]
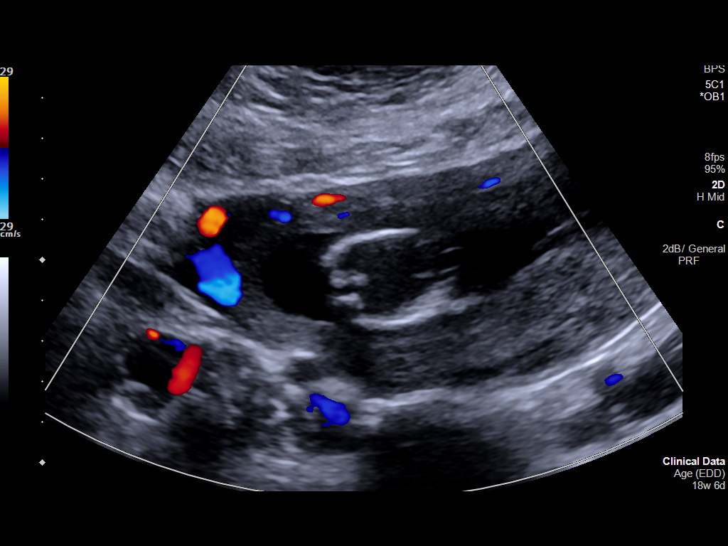

[14 of 16 positions shown; findings below may reference images not displayed]

FINDINGS: Number of Fetuses: 1

Heart Rate:  Not detected.

Movement: Not visualized.

Presentation: Cephalic.

Placental Location: Posterior.

Previa: None.

Amniotic Fluid (Subjective):  Within normal limits.

FL: 2.1 cm 16 w  1 d

MATERNAL FINDINGS:

Cervix:  Appears closed.

Uterus/Adnexae: No abnormality visualized.
IMPRESSION: Findings consistent with fetal demise.

This exam is performed on an emergent basis and does not
comprehensively evaluate fetal size, dating, or anatomy; follow-up
complete OB US should be considered if further fetal assessment is
warranted.

## 2021-02-16 ENCOUNTER — Other Ambulatory Visit: Payer: Self-pay

## 2021-02-16 ENCOUNTER — Encounter: Payer: Self-pay | Admitting: Family Medicine

## 2021-02-16 ENCOUNTER — Ambulatory Visit (INDEPENDENT_AMBULATORY_CARE_PROVIDER_SITE_OTHER): Payer: Medicaid Other | Admitting: Family Medicine

## 2021-02-16 VITALS — BP 135/89 | HR 71 | Ht 61.0 in | Wt 210.0 lb

## 2021-02-16 DIAGNOSIS — R350 Frequency of micturition: Secondary | ICD-10-CM | POA: Diagnosis not present

## 2021-02-16 DIAGNOSIS — N3001 Acute cystitis with hematuria: Secondary | ICD-10-CM

## 2021-02-16 LAB — POCT URINALYSIS DIPSTICK
Glucose, UA: NEGATIVE
Ketones, UA: NEGATIVE
Nitrite, UA: POSITIVE
Protein, UA: NEGATIVE
Spec Grav, UA: 1.015 (ref 1.010–1.025)
Urobilinogen, UA: 4 E.U./dL — AB
pH, UA: 8 (ref 5.0–8.0)

## 2021-02-16 MED ORDER — CEPHALEXIN 500 MG PO CAPS
500.0000 mg | ORAL_CAPSULE | Freq: Three times a day (TID) | ORAL | 0 refills | Status: DC
Start: 2021-02-16 — End: 2022-03-31

## 2021-02-16 NOTE — Patient Instructions (Addendum)
Thank you for coming to the office today. ° °1. You have a Urinary Tract Infection - this is very common, your symptoms are reassuring and you should get better within 1 week on the antibiotics °- Start Keflex 500mg 3 times daily for next 7 days, complete entire course, even if feeling better °- We sent urine for a culture, we will call you within next few days if we need to change antibiotics °- Please drink plenty of fluids, improve hydration over next 1 week ° °If symptoms worsening, developing nausea / vomiting, worsening back pain, fevers / chills / sweats, then please return for re-evaluation sooner. ° °If you take AZO OTC - limit this to 2-3 days MAX to avoid affecting kidneys ° °D-Mannose is a natural supplement that can actually help bind to urinary bacteria and reduce their effectiveness it can help prevent UTI from forming, and may reduce some symptoms. It likely cannot cure an active UTI but it is worth a try and good to prevent them with. Try 500mg twice a day at a full dose if you want, or check package instructions for more info ° ° °Please schedule a Follow-up Appointment to: Return if symptoms worsen or fail to improve, for UTI. ° °If you have any other questions or concerns, please feel free to call the office or send a message through MyChart. You may also schedule an earlier appointment if necessary. ° °Additionally, you may be receiving a survey about your experience at our office within a few days to 1 week by e-mail or mail. We value your feedback. ° °Michal Callicott, DO °[facility] °

## 2021-02-16 NOTE — Progress Notes (Signed)
Subjective:    Patient ID: Pamela Schmidt, female    DOB: May 25, 1986, 35 y.o.   MRN: [id number]  Pamela Schmidt is a 35 y.o. female presenting on 02/16/2021 for Urinary Frequency  Patient presents for a same day appointment.  Previous PCP Danielle Rankin, FNP   HPI   UTI / Urinary Frequency Reports onset this past weekend with increased urinary frequency and full bladder with difficulty voiding, reduced urine output. Worsening past 2 days ago with dysuria now. She took AZO yesterday. Did not take any. Admits some low back discomfort or pain radiating Admits pelvic or abdominal cramping discomfort Denies any fever chill sweats nausea vomiting  Depression screen [facility] 2/9 03/19/2020 04/01/2019 03/11/2019  Decreased Interest 0 1 3  Down, Depressed, Hopeless 0 1 3  PHQ - 2 Score 0 2 6  Altered sleeping - 3 3  Tired, decreased energy - 2 3  Change in appetite - 3 2  Feeling bad or failure about yourself  - 0 3  Trouble concentrating - 3 3  Moving slowly or fidgety/restless - 1 2  Suicidal thoughts - 0 0  PHQ-9 Score - 14 22  Difficult doing work/chores - Very difficult -    Social History   Tobacco Use  . Smoking status: Former Smoker    Quit date: 01/30/2010    Years since quitting: 11.0  . Smokeless tobacco: Never Used  Vaping Use  . Vaping Use: Never used  Substance Use Topics  . Alcohol use: Yes    Alcohol/week: 2.0 standard drinks    Types: 2 Glasses of wine per week    Comment: occasional  . Drug use: No    Review of Systems Per HPI unless specifically indicated above     Objective:    BP 135/89   Pulse 71   Ht 5\' 1"  (1.549 m)   Wt 210 lb (95.3 kg)   SpO2 94%   BMI 39.68 kg/m   Wt Readings from Last 3 Encounters:  02/16/21 210 lb (95.3 kg)  03/19/20 216 lb 12.8 oz (98.3 kg)  02/19/20 215 lb 6.4 oz (97.7 kg)    Physical Exam Vitals and nursing note reviewed.  Constitutional:      General: She is not in acute distress.    Appearance: She is well-developed.  She is not diaphoretic.     Comments: Well-appearing, comfortable, cooperative  HENT:     Head: Normocephalic and atraumatic.  Eyes:     General:        Right eye: No discharge.        Left eye: No discharge.     Conjunctiva/sclera: Conjunctivae normal.  Cardiovascular:     Rate and Rhythm: Normal rate.  Pulmonary:     Effort: Pulmonary effort is normal.  Skin:    General: Skin is warm and dry.     Findings: No erythema or rash.  Neurological:     Mental Status: She is alert and oriented to person, place, and time.  Psychiatric:        Behavior: Behavior normal.     Comments: Well groomed, good eye contact, normal speech and thoughts    Results for orders placed or performed in visit on 02/16/21  POCT Urinalysis Dipstick  Result Value Ref Range   Color, UA Orange    Clarity, UA Cloudy    Glucose, UA Negative Negative   Bilirubin, UA Moderate ++    Ketones, UA Negative    Spec Grav,  UA 1.015 1.010 - 1.025   Blood, UA Moderate ++    pH, UA 8.0 5.0 - 8.0   Protein, UA Negative Negative   Urobilinogen, UA 4.0 (A) 0.2 or 1.0 E.U./dL   Nitrite, UA Positive    Leukocytes, UA Trace (A) Negative   Appearance     Odor        Assessment & Plan:   Problem List Items Addressed This Visit   None   Visit Diagnoses    Urinary frequency    -  Primary   Relevant Orders   POCT Urinalysis Dipstick (Completed)   Urine Culture      Clinically consistent with UTI and confirmed on UA. No recent UTIs or abx courses.  No concern for pyelo today (no systemic symptoms, neg fever, flank pain, n/v).  Plan: 1. UA  Nitrite positive, trace leuks, RBC positive 2. Ordered Urine culture 3. Keflex 500mg  TID x 7 days - PCN allergy but tolerates Keflex and had only mild allergy rash to PCN in past. - counseling on AZO 4. Improve PO hydration 5. RTC if no improvement 1-2 weeks, red flags given to return sooner  Orders Placed This Encounter  Procedures  . Urine Culture  . POCT Urinalysis  Dipstick     No orders of the defined types were placed in this encounter.     Follow up plan: Return if symptoms worsen or fail to improve, for UTI.   , DO [facility] 02/16/2021, 11:01 AM

## 2021-02-18 LAB — URINE CULTURE
MICRO NUMBER:: 11683345
SPECIMEN QUALITY:: ADEQUATE

## 2021-02-28 ENCOUNTER — Other Ambulatory Visit: Payer: Self-pay

## 2021-02-28 DIAGNOSIS — F419 Anxiety disorder, unspecified: Secondary | ICD-10-CM

## 2021-03-01 MED ORDER — SERTRALINE HCL 100 MG PO TABS: 100.0000 mg | ORAL_TABLET | Freq: Every day | ORAL | 0 refills | Status: DC

## 2021-03-22 ENCOUNTER — Ambulatory Visit: Payer: Self-pay

## 2021-03-22 NOTE — Telephone Encounter (Signed)
Poison ivy noted on Sunday - Went fishing this past weekend. Rash located: groin area, between buttocks. Blisters "everywhere" size of half of dime and pen sized. Benadryl last 2  Days.  IVYREST- IVY rash bath without effect. Itching is severe and preventing her from sleeping. Advised pt to go to [facility] for evaluation and tx. Care advice given and pt verbalized understanding.   Reason for Disposition . Big blisters or oozing sores  Answer Assessment - Initial Assessment Questions 1. APPEARANCE of RASH: "Describe the rash."      Blisters, red, weepy yellow fluid.  2. LOCATION: "Where is the rash located?"      Groin area, between buttocks 3. SIZE: "How large is the rash?"      Half a dime size sized to a pin point sized 4. ONSET: "When did the rash begin?"      Sunday night 5. ITCHING: "Does the rash itch?" If Yes, ask: "How bad is it?"   - MILD - doesn't interfere with normal activities   - MODERATE-SEVERE: interferes with work, school, sleep, or other activities      severe 6. PREGNANCY: "Is there any chance you are pregnant?" "When was your last menstrual period?"     No LMP: 2 weeks ago  Protocols used: POISON IVY - OAK - SUMAC-A-AH

## 2021-04-09 ENCOUNTER — Other Ambulatory Visit: Payer: Self-pay

## 2021-04-09 ENCOUNTER — Emergency Department
Admission: EM | Admit: 2021-04-09 | Discharge: 2021-04-09 | Disposition: A | Discharge status: Home / Self Care | Payer: Medicaid Other | Attending: Emergency Medicine | Admitting: Emergency Medicine

## 2021-04-09 DIAGNOSIS — R000 Tachycardia, unspecified: Secondary | ICD-10-CM | POA: Diagnosis not present

## 2021-04-09 DIAGNOSIS — H5712 Ocular pain, left eye: Secondary | ICD-10-CM | POA: Diagnosis present

## 2021-04-09 DIAGNOSIS — T1592XA Foreign body on external eye, part unspecified, left eye, initial encounter: Secondary | ICD-10-CM | POA: Insufficient documentation

## 2021-04-09 DIAGNOSIS — X58XXXA Exposure to other specified factors, initial encounter: Secondary | ICD-10-CM | POA: Insufficient documentation

## 2021-04-09 DIAGNOSIS — Z87891 Personal history of nicotine dependence: Secondary | ICD-10-CM | POA: Diagnosis not present

## 2021-04-09 DIAGNOSIS — T1502XA Foreign body in cornea, left eye, initial encounter: Secondary | ICD-10-CM | POA: Diagnosis not present

## 2021-04-09 MED ORDER — KETOROLAC TROMETHAMINE 0.5 % OP SOLN: 1.0000 [drp] | Freq: Four times a day (QID) | OPHTHALMIC | 0 refills | Status: AC

## 2021-04-09 MED ORDER — OXYCODONE-ACETAMINOPHEN 5-325 MG PO TABS
1.0000 | ORAL_TABLET | Freq: Once | ORAL | Status: AC
  Administered 2021-04-09: 1 via ORAL
  Filled 2021-04-09: qty 1

## 2021-04-09 MED ORDER — ERYTHROMYCIN 5 MG/GM OP OINT: 1.0000 "application " | TOPICAL_OINTMENT | Freq: Every day | OPHTHALMIC | 0 refills | Status: AC

## 2021-04-09 MED ORDER — FLUORESCEIN SODIUM 1 MG OP STRP
1.0000 | ORAL_STRIP | Freq: Once | OPHTHALMIC | Status: AC
  Administered 2021-04-09: 1 via OPHTHALMIC
  Filled 2021-04-09: qty 1

## 2021-04-09 MED ORDER — ONDANSETRON 4 MG PO TBDP
4.0000 mg | ORAL_TABLET | Freq: Once | ORAL | Status: AC
  Administered 2021-04-09: 4 mg via ORAL
  Filled 2021-04-09: qty 1

## 2021-04-09 MED ORDER — TETRACAINE HCL 0.5 % OP SOLN
1.0000 [drp] | Freq: Once | OPHTHALMIC | Status: AC
  Administered 2021-04-09: 1 [drp] via OPHTHALMIC
  Filled 2021-04-09: qty 4

## 2021-04-09 NOTE — ED Notes (Signed)
E signature pad not working. Pt educated on discharge instructions and verbalized understanding.  

## 2021-04-09 NOTE — Discharge Instructions (Addendum)
You can apply Ketorolac up to four times a day for 5 days.  Apply erythromycin before bed at night. If you are experiencing eye pain in the morning, please call Dr. Skip Estimable office to be evaluated.

## 2021-04-09 NOTE — ED Triage Notes (Signed)
Pt c/o getting fingernail glue in L eye PTA.  Pt c/o severe pain at this time.  Pt sts she has contacts in. Pt reports rinsing eyes PTA.

## 2021-04-09 NOTE — ED Provider Notes (Signed)
[facility]-EMERGENCY DEPARTMENT  ____________________________________________  Time seen: Approximately 11:14 PM  I have reviewed the triage vital signs and the nursing notes.   HISTORY  Chief Complaint Eye Problem (Glue in L eye)   Historian Patient    HPI Pamela Schmidt is a 35 y.o. female presents to the emergency department with 10 out of 10 left eye pain.  Patient reached for a bottle of lubricating eyedrops and accidentally picked up a bottle of nail glue.  Patient had a contact lens in place at the time.  She copiously irrigated her left eye.  No blurry vision.  She has had increased tearing and has glue clumped in the upper eyelashes.   Past Medical History:  Diagnosis Date  . Anxiety   . Chicken pox   . History of herpes genitalis      Immunizations up to date:  Yes.     Past Medical History:  Diagnosis Date  . Anxiety   . Chicken pox   . History of herpes genitalis     Patient Active Problem List   Diagnosis Date Noted  . Acute non-recurrent sinusitis 11/24/2020  . Routine medical exam 03/19/2020  . BMI 40.0-44.9, adult (HCC) 03/19/2020  . Left knee pain 02/19/2020  . Encounter to establish care with new doctor 02/19/2020  . Missed abortion with fetal demise before 20 completed weeks of gestation 03/18/2019  . History of herpes genitalis 01/31/2019  . History of VBAC 09/05/2014  . Adult BMI 30+ 09/05/2014  . Anxiety 02/01/2014    Past Surgical History:  Procedure Laterality Date  . CESAREAN SECTION  2005, 2010  . TONSILLECTOMY      Prior to Admission medications   Medication Sig Start Date End Date Taking? Authorizing Provider  erythromycin ophthalmic ointment Place 1 application into the left eye at bedtime for 7 days. 04/09/21 04/16/21 Yes Pia Mau M, PA-C  ketorolac (ACULAR) 0.5 % ophthalmic solution Place 1 drop into the left eye 4 (four) times daily for 5 days. 04/09/21 04/14/21 Yes Pia Mau M, PA-C  azelastine (ASTELIN) 0.1 % nasal  spray Place 2 sprays into both nostrils 2 (two) times daily. Use in each nostril as directed 11/24/20   Malfi, Jodelle Gross, FNP  cephALEXin (KEFLEX) 500 MG capsule Take 1 capsule (500 mg total) by mouth 3 (three) times daily. For 7 days 02/16/21   Smitty Cords, DO  diphenhydramine-acetaminophen (TYLENOL PM) 25-500 MG TABS tablet Take 1 tablet by mouth at bedtime as needed.    [provider]  Ibuprofen 200 MG CAPS Take 800 capsules by mouth every 8 (eight) hours.    [provider]  Naltrexone-buPROPion HCl ER (CONTRAVE) 8-90 MG TB12 Start 1 tablet every morning for 7 days, then 1 tablet twice daily for 7 days, then 2 tablets every morning and one every evening Patient not taking: No sig reported 03/19/20   Tarri Fuller, FNP  norethindrone-ethinyl estradiol (JUNEL FE 1/20) 1-20 MG-MCG tablet Take 1 tablet by mouth daily. Patient not taking: No sig reported 04/01/19   Shambley, Melody N, CNM  promethazine-dextromethorphan (PROMETHAZINE-DM) 6.25-15 MG/5ML syrup Take 5 mLs by mouth 4 (four) times daily as needed for cough. 11/24/20   Malfi, Jodelle Gross, FNP  sertraline (ZOLOFT) 100 MG tablet Take 1 tablet (100 mg total) by mouth daily. 03/01/21   Smitty Cords, DO    Allergies Penicillins  Family History  Problem Relation Age of Onset  . Hypertension Mother   . Diverticulitis Mother   .  Diabetes Father   . Diverticulitis Maternal Aunt   . Diverticulitis Maternal Grandmother   . Bipolar disorder Maternal Aunt   . Breast cancer Other   . Ovarian cancer Neg Hx   . Colon cancer Neg Hx     Social History Social History   Tobacco Use  . Smoking status: Former Smoker    Quit date: 01/30/2010    Years since quitting: 11.1  . Smokeless tobacco: Never Used  Vaping Use  . Vaping Use: Never used  Substance Use Topics  . Alcohol use: Yes    Alcohol/week: 2.0 standard drinks    Types: 2 Glasses of wine per week    Comment: occasional  . Drug use: No      Review of Systems  Constitutional: No fever/chills Eyes:  Patient has left eye pain.  ENT: No upper respiratory complaints. Respiratory: no cough. No SOB/ use of accessory muscles to breath Gastrointestinal:   No nausea, no vomiting.  No diarrhea.  No constipation. Musculoskeletal: Negative for musculoskeletal pain. Skin: Negative for rash, abrasions, lacerations, ecchymosis.    ____________________________________________   PHYSICAL EXAM:  VITAL SIGNS: ED Triage Vitals  Enc Vitals Group     BP 04/09/21 2217 128/75     Pulse Rate 04/09/21 2217 (!) 105     Resp 04/09/21 2217 20     Temp 04/09/21 2217 98.2 F (36.8 C)     Temp Source 04/09/21 2217 Oral     SpO2 04/09/21 2217 99 %     Weight 04/09/21 2218 180 lb (81.6 kg)     Height 04/09/21 2218 5\' 1"  (1.549 m)     Head Circumference --      Peak Flow --      Pain Score --      Pain Loc --      Pain Edu? --      Excl. in GC? --      Constitutional: Alert and oriented. Well appearing and in no acute distress. Eyes: Conjunctivae are normal. PERRL. EOMI. eye contact was removed with irrigation.  With fluorescein, patient has trace amount of dried glue at the inferior aspect of left sclera.  There is clumped dried glue in upper eyelashes. Head: Atraumatic. ENT:      Nose: No congestion/rhinnorhea.      Mouth/Throat: Mucous membranes are moist.  Neck: No stridor.  No cervical spine tenderness to palpation.  Cardiovascular: Normal rate, regular rhythm. Normal S1 and S2.  Good peripheral circulation. Respiratory: Normal respiratory effort without tachypnea or retractions. Lungs CTAB. Good air entry to the bases with no decreased or absent breath sounds Gastrointestinal: Bowel sounds x 4 quadrants. Soft and nontender to palpation. No guarding or rigidity. No distention. Musculoskeletal: Full range of motion to all extremities. No obvious deformities noted Neurologic:  Normal for age. No gross focal neurologic deficits  are appreciated.  Skin:  Skin is warm, dry and intact. No rash noted. Psychiatric: Mood and affect are normal for age. Speech and behavior are normal.   ____________________________________________   LABS (all labs ordered are listed, but only abnormal results are displayed)  Labs Reviewed - No data to display ____________________________________________  EKG   ____________________________________________  RADIOLOGY   No results found.  ____________________________________________    PROCEDURES  Procedure(s) performed:     Procedures     Medications  tetracaine (PONTOCAINE) 0.5 % ophthalmic solution 1 drop (has no administration in time range)  fluorescein ophthalmic strip 1 strip (has no administration in time  range)     ____________________________________________   INITIAL IMPRESSION / ASSESSMENT AND PLAN / ED COURSE  Pertinent labs & imaging results that were available during my care of the patient were reviewed by me and considered in my medical decision making (see chart for details).      Assessment and plan Left eye foreign body 35 year old female presents to the emergency department after she accidentally reached for a bottle of nail glue.  Patient was tachycardic at triage but vital signs were otherwise reassuring.  Patient was able to keep her upper and lower eyelids open.  Patient did have a trace amount of dried glue along inferior aspect of left sclera and she had a clump of dried glue in her upper left eyelashes.  I reached out to ophthalmologist on-call, Dr. Inez Pilgrim.  He recommended topical erythromycin and ketorolac drops for pain.  He plans on seeing patient as an outpatient either on Sunday or Monday depending on symptom severity.      ____________________________________________  FINAL CLINICAL IMPRESSION(S) / ED DIAGNOSES  Final diagnoses:  Foreign body of left eye, initial encounter      NEW MEDICATIONS STARTED DURING  THIS VISIT:  ED Discharge Orders         Ordered    erythromycin ophthalmic ointment  Daily at bedtime        04/09/21 2254    ketorolac (ACULAR) 0.5 % ophthalmic solution  4 times daily        05 /14/22 2256              This chart was dictated using voice recognition software/Dragon. Despite best efforts to proofread, errors can occur which can change the meaning. Any change was purely unintentional.     11-29-1972, PA-C 04/09/21 2322    04/11/21, MD 04/09/21 2326

## 2021-04-09 NOTE — ED Notes (Signed)
Pt assisted to eye wash station by NT. MLP notified of pt.

## 2021-04-11 ENCOUNTER — Telehealth: Payer: Self-pay

## 2021-04-11 DIAGNOSIS — S0502XD Injury of conjunctiva and corneal abrasion without foreign body, left eye, subsequent encounter: Secondary | ICD-10-CM | POA: Diagnosis not present

## 2021-04-11 NOTE — Telephone Encounter (Signed)
Transition Care Management Unsuccessful Follow-up Telephone Call  Date of discharge and from where:  04/10/2021 from [facility]  Attempts:  1st Attempt  Reason for unsuccessful TCM follow-up call:  Left voice message

## 2021-04-12 NOTE — Telephone Encounter (Signed)
Transition Care Management Unsuccessful Follow-up Telephone Call  Date of discharge and from where:  04/09/2021 from [facility]  Attempts:  2nd Attempt  Reason for unsuccessful TCM follow-up call:  Left voice message

## 2021-04-13 NOTE — Telephone Encounter (Signed)
Transition Care Management Unsuccessful Follow-up Telephone Call  Date of discharge and from where:  04/10/2021 from [facility]  Attempts:  3rd Attempt  Reason for unsuccessful TCM follow-up call:  Unable to reach patient

## 2022-03-02 DIAGNOSIS — J020 Streptococcal pharyngitis: Secondary | ICD-10-CM | POA: Diagnosis not present

## 2022-03-28 ENCOUNTER — Encounter: Payer: Self-pay | Admitting: Internal Medicine

## 2022-03-29 MED ORDER — VALACYCLOVIR HCL 500 MG PO TABS: 500.0000 mg | ORAL_TABLET | Freq: Two times a day (BID) | ORAL | 0 refills | Status: DC

## 2022-03-29 MED ORDER — LIDOCAINE-PRILOCAINE 2.5-2.5 % EX CREA: TOPICAL_CREAM | Freq: Two times a day (BID) | CUTANEOUS | 0 refills | Status: DC | PRN

## 2022-03-31 ENCOUNTER — Encounter: Payer: Self-pay | Admitting: Internal Medicine

## 2022-03-31 ENCOUNTER — Ambulatory Visit (INDEPENDENT_AMBULATORY_CARE_PROVIDER_SITE_OTHER): Payer: Medicaid Other | Admitting: Internal Medicine

## 2022-03-31 VITALS — BP 134/84 | HR 68 | Temp 97.5°F | Ht 61.0 in | Wt 222.0 lb

## 2022-03-31 DIAGNOSIS — R7309 Other abnormal glucose: Secondary | ICD-10-CM | POA: Diagnosis not present

## 2022-03-31 DIAGNOSIS — Z0001 Encounter for general adult medical examination with abnormal findings: Secondary | ICD-10-CM | POA: Diagnosis not present

## 2022-03-31 DIAGNOSIS — E6609 Other obesity due to excess calories: Secondary | ICD-10-CM | POA: Insufficient documentation

## 2022-03-31 DIAGNOSIS — F419 Anxiety disorder, unspecified: Secondary | ICD-10-CM | POA: Diagnosis not present

## 2022-03-31 DIAGNOSIS — Z1159 Encounter for screening for other viral diseases: Secondary | ICD-10-CM

## 2022-03-31 DIAGNOSIS — E785 Hyperlipidemia, unspecified: Secondary | ICD-10-CM | POA: Diagnosis not present

## 2022-03-31 DIAGNOSIS — Z6833 Body mass index (BMI) 33.0-33.9, adult: Secondary | ICD-10-CM | POA: Insufficient documentation

## 2022-03-31 MED ORDER — VALACYCLOVIR HCL 500 MG PO TABS: 500.0000 mg | ORAL_TABLET | Freq: Two times a day (BID) | ORAL | 0 refills | Status: DC | PRN

## 2022-03-31 NOTE — Assessment & Plan Note (Signed)
Stable off meds Support offered 

## 2022-03-31 NOTE — Assessment & Plan Note (Signed)
Encouraged low carb diet and exercise for weight loss ?Discussed using Saxenda/Wegovy. She will check with her insurance company and let me know if she would like to try this. ?

## 2022-03-31 NOTE — Patient Instructions (Signed)

## 2022-03-31 NOTE — Progress Notes (Signed)
? ?Subjective:  ? ? Patient ID: Pamela Schmidt, female    DOB: 24-Jun-1986, 36 y.o.   MRN: [id number] ? ?HPI ? ?Patient presents to clinic today for her annual exam.  She is also due to follow-up chronic conditions. ? ?Anxiety: Chronic. She is not currently taking anything for this. She was prescribed Sertraline but did not like the way it made her feel. She is not currently seeing a therapist.  She denies depression, SI/HI. ? ?Genital Herpes: She reports recent outbreak.  She is taking Valacyclovir and Lidocaine cream as prescribed. ? ?Flu: 07/2014 ?Tetanus: 07/2014 ?COVID: never ?Pap smear: 01/2019 ?Dentist: biannually ? ?Diet: She does eat meat. She consumes fruits and veggies. She tries to avoid fried foods. She drinks mostly water, Dr. Reino Kent Zero ?Exercise: Gym 3 x week, cardio and lifting weights ? ?Review of Systems ? ?   ?Past Medical History:  ?Diagnosis Date  ? Anxiety   ? Chicken pox   ? History of herpes genitalis   ? ? ?Current Outpatient Medications  ?Medication Sig Dispense Refill  ? azelastine (ASTELIN) 0.1 % nasal spray Place 2 sprays into both nostrils 2 (two) times daily. Use in each nostril as directed 30 mL 12  ? cephALEXin (KEFLEX) 500 MG capsule Take 1 capsule (500 mg total) by mouth 3 (three) times daily. For 7 days 21 capsule 0  ? diphenhydramine-acetaminophen (TYLENOL PM) 25-500 MG TABS tablet Take 1 tablet by mouth at bedtime as needed.    ? Ibuprofen 200 MG CAPS Take 800 capsules by mouth every 8 (eight) hours.    ? lidocaine-prilocaine (EMLA) cream Apply topically 2 (two) times daily as needed. Use as directed 30 g 0  ? Naltrexone-buPROPion HCl ER (CONTRAVE) 8-90 MG TB12 Start 1 tablet every morning for 7 days, then 1 tablet twice daily for 7 days, then 2 tablets every morning and one every evening (Patient not taking: No sig reported) 120 tablet 0  ? norethindrone-ethinyl estradiol (JUNEL FE 1/20) 1-20 MG-MCG tablet Take 1 tablet by mouth daily. (Patient not taking: No sig reported) 3 Package  4  ? promethazine-dextromethorphan (PROMETHAZINE-DM) 6.25-15 MG/5ML syrup Take 5 mLs by mouth 4 (four) times daily as needed for cough. 118 mL 0  ? sertraline (ZOLOFT) 100 MG tablet Take 1 tablet (100 mg total) by mouth daily. 90 tablet 0  ? valACYclovir (VALTREX) 500 MG tablet Take 1 tablet (500 mg total) by mouth 2 (two) times daily. 6 tablet 0  ? ?No current facility-administered medications for this visit.  ? ? ?Allergies  ?Allergen Reactions  ? Penicillins Itching and Rash  ?  Did it involve swelling of the face/tongue/throat, SOB, or low BP? No ?Did it involve sudden or severe rash/hives, skin peeling, or any reaction on the inside of your mouth or nose? Yes ?Did you need to seek medical attention at a hospital or doctor's office? Unknown ?When did it last happen?      Unknown ?If all above answers are ?NO?, may proceed with cephalosporin use. ?  ? ? ?Family History  ?Problem Relation Age of Onset  ? Hypertension Mother   ? Diverticulitis Mother   ? Diabetes Father   ? Diverticulitis Maternal Aunt   ? Diverticulitis Maternal Grandmother   ? Bipolar disorder Maternal Aunt   ? Breast cancer Other   ? Ovarian cancer Neg Hx   ? Colon cancer Neg Hx   ? ? ?Social History  ? ?Socioeconomic History  ? Marital status: Divorced  ?  Spouse name: Not on file  ? Number of children: 3  ? Years of education: 59  ? Highest education level: Not on file  ?Occupational History  ? Occupation: Sales executive  ?  Employer: [employer]  ?  Comment: [place], [place]  ?Tobacco Use  ? Smoking status: Former  ?  Types: Cigarettes  ?  Quit date: 01/30/2010  ?  Years since quitting: 12.1  ? Smokeless tobacco: Never  ?Vaping Use  ? Vaping Use: Never used  ?Substance and Sexual Activity  ? Alcohol use: Yes  ?  Alcohol/week: 2.0 standard drinks  ?  Types: 2 Glasses of wine per week  ?  Comment: occasional  ? Drug use: No  ? Sexual activity: Yes  ?  Partners: Male  ?  Birth control/protection: None  ?Other Topics Concern  ? Not on file   ?Social History Narrative  ? Deannie was born in [place] and then moved to [place] area when she was in middle school. She lives at home with her children. She works as a Sales executive. She enjoys working outside in the yard and shopping.  ? ?Social Determinants of Health  ? ?Financial Resource Strain: Not on file  ?Food Insecurity: Not on file  ?Transportation Needs: Not on file  ?Physical Activity: Not on file  ?Stress: Not on file  ?Social Connections: Not on file  ?Intimate Partner Violence: Not on file  ? ? ? ?Constitutional: Denies fever, malaise, fatigue, headache or abrupt weight changes.  ?HEENT: Denies eye pain, eye redness, ear pain, ringing in the ears, wax buildup, runny nose, nasal congestion, bloody nose, or sore throat. ?Respiratory: Denies difficulty breathing, shortness of breath, cough or sputum production.   ?Cardiovascular: Denies chest pain, chest tightness, palpitations or swelling in the hands or feet.  ?Gastrointestinal: Denies abdominal pain, bloating, constipation, diarrhea or blood in the stool.  ?GU: Denies urgency, frequency, pain with urination, burning sensation, blood in urine, odor or discharge. ?Musculoskeletal: Denies decrease in range of motion, difficulty with gait, muscle pain or joint pain and swelling.  ?Skin: Denies redness, rashes, lesions or ulcercations.  ?Neurological: Denies dizziness, difficulty with memory, difficulty with speech or problems with balance and coordination.  ?Psych: Patient has a history of anxiety.  Denies depression, SI/HI. ? ?No other specific complaints in a complete review of systems (except as listed in HPI above). ? ?Objective:  ? Physical Exam ? ?BP 134/84 (BP Location: Left Arm, Patient Position: Sitting, Cuff Size: Large)   Pulse 68   Temp (!) 97.5 ?F (36.4 ?C) (Temporal)   Ht 5\' 1"  (1.549 m)   Wt 222 lb (100.7 kg)   SpO2 99%   BMI 41.95 kg/m?  ? ?Wt Readings from Last 3 Encounters:  ?04/09/21 180 lb (81.6 kg)  ?02/16/21 210 lb  (95.3 kg)  ?03/19/20 216 lb 12.8 oz (98.3 kg)  ? ? ?General: Appears her stated age, obese, in NAD. ?Skin: Warm, dry and intact.  ?HEENT: Head: normal shape and size; Eyes: sclera white, no icterus, conjunctiva pink, PERRLA and EOMs intact;  ?Neck:  Neck supple, trachea midline. No masses, lumps or thyromegaly present.  ?Cardiovascular: Normal rate and rhythm. S1,S2 noted.  No murmur, rubs or gallops noted. No JVD or BLE edema.  ?Pulmonary/Chest: Normal effort and positive vesicular breath sounds. No respiratory distress. No wheezes, rales or ronchi noted.  ?Abdomen: Soft and nontender. Normal bowel sounds.  ?Musculoskeletal: Strength 5/5 BUE/BLE. No difficulty with gait.  ?Neurological: Alert and  oriented. Cranial nerves II-XII grossly intact. Coordination normal.  ?Psychiatric: Mood and affect normal. Behavior is normal. Judgment and thought content normal.  ? ? ?BMET ?   ?Component Value Date/Time  ? NA 138 03/19/2020 0855  ? K 4.1 03/19/2020 0855  ? CL 105 03/19/2020 0855  ? CO2 27 03/19/2020 0855  ? GLUCOSE 68 03/19/2020 0855  ? BUN 18 03/19/2020 0855  ? CREATININE 0.67 03/19/2020 0855  ? CALCIUM 8.9 03/19/2020 0855  ? GFRNONAA 113 03/19/2020 0855  ? GFRAA 131 03/19/2020 0855  ? ? ?Lipid Panel  ?   ?Component Value Date/Time  ? CHOL 180 03/19/2020 0855  ? TRIG 64 03/19/2020 0855  ? HDL 75 03/19/2020 0855  ? CHOLHDL 2.4 03/19/2020 0855  ? LDLCALC 90 03/19/2020 0855  ? ? ?CBC ?   ?Component Value Date/Time  ? WBC 5.8 03/19/2020 0855  ? RBC 4.68 03/19/2020 0855  ? HGB 13.9 03/19/2020 0855  ? HGB 12.9 01/14/2019 1451  ? HCT 41.9 03/19/2020 0855  ? HCT 38.6 01/14/2019 1451  ? PLT 241 03/19/2020 0855  ? PLT 241 01/14/2019 1451  ? MCV 89.5 03/19/2020 0855  ? MCV 88 01/14/2019 1451  ? MCV 88 03/12/2014 0153  ? MCH 29.7 03/19/2020 0855  ? MCHC 33.2 03/19/2020 0855  ? RDW 11.8 03/19/2020 0855  ? RDW 11.9 01/14/2019 1451  ? RDW 12.5 03/12/2014 0153  ? LYMPHSABS 1,531 03/19/2020 0855  ? MONOABS 0.7 12/29/2018 1137  ?  EOSABS 209 03/19/2020 0855  ? BASOSABS 52 03/19/2020 0855  ? ? ?Hgb A1C ?Lab Results  ?Component Value Date  ? HGBA1C 4.8 01/14/2019  ? ? ? ? ? ? ?   ?Assessment & Plan:  ? ?Preventative Health Maintenance: ? ?[place]

## 2022-04-02 LAB — CBC
HCT: 40.6 % (ref 35.0–45.0)
Hemoglobin: 13.4 g/dL (ref 11.7–15.5)
MCH: 29.3 pg (ref 27.0–33.0)
MCHC: 33 g/dL (ref 32.0–36.0)
MCV: 88.8 fL (ref 80.0–100.0)
MPV: 11.3 fL (ref 7.5–12.5)
Platelets: 267 10*3/uL (ref 140–400)
RBC: 4.57 10*6/uL (ref 3.80–5.10)
RDW: 11.9 % (ref 11.0–15.0)
WBC: 7.6 10*3/uL (ref 3.8–10.8)

## 2022-04-02 LAB — COMPLETE METABOLIC PANEL WITH GFR
AG Ratio: 1.7 (calc) (ref 1.0–2.5)
ALT: 17 U/L (ref 6–29)
AST: 17 U/L (ref 10–30)
Albumin: 3.7 g/dL (ref 3.6–5.1)
Alkaline phosphatase (APISO): 71 U/L (ref 31–125)
BUN: 15 mg/dL (ref 7–25)
CO2: 26 mmol/L (ref 20–32)
Calcium: 8.3 mg/dL — ABNORMAL LOW (ref 8.6–10.2)
Chloride: 105 mmol/L (ref 98–110)
Creat: 0.71 mg/dL (ref 0.50–0.97)
Globulin: 2.2 g/dL (calc) (ref 1.9–3.7)
Glucose, Bld: 82 mg/dL (ref 65–139)
Potassium: 4.1 mmol/L (ref 3.5–5.3)
Sodium: 139 mmol/L (ref 135–146)
Total Bilirubin: 0.4 mg/dL (ref 0.2–1.2)
Total Protein: 5.9 g/dL — ABNORMAL LOW (ref 6.1–8.1)
eGFR: 112 mL/min/{1.73_m2} (ref >=60)

## 2022-04-02 LAB — HEMOGLOBIN A1C
Hgb A1c MFr Bld: 4.9 % of total Hgb (ref <5.7)
Mean Plasma Glucose: 94 mg/dL
eAG (mmol/L): 5.2 mmol/L

## 2022-04-02 LAB — HEPATITIS C ANTIBODY
Hepatitis C Ab: NONREACTIVE
SIGNAL TO CUT-OFF: 0.18 (ref <1.00)

## 2022-04-02 LAB — LIPID PANEL
Cholesterol: 195 mg/dL (ref <200)
HDL: 61 mg/dL (ref >=50)
LDL Cholesterol (Calc): 106 mg/dL (calc) — ABNORMAL HIGH
Non-HDL Cholesterol (Calc): 134 mg/dL (calc) — ABNORMAL HIGH (ref <130)
Total CHOL/HDL Ratio: 3.2 (calc) (ref <5.0)
Triglycerides: 161 mg/dL — ABNORMAL HIGH (ref <150)

## 2022-04-03 NOTE — Telephone Encounter (Signed)
Pt advised of lab results.  See results note.   Thanks,   -Mickel Schreur  

## 2022-04-06 ENCOUNTER — Encounter: Payer: Self-pay | Admitting: Internal Medicine

## 2022-04-21 ENCOUNTER — Encounter: Payer: Self-pay | Admitting: Internal Medicine

## 2022-04-25 MED ORDER — PHENTERMINE HCL 37.5 MG PO CAPS: 37.5000 mg | ORAL_CAPSULE | ORAL | 0 refills | Status: DC

## 2022-05-26 ENCOUNTER — Ambulatory Visit: Payer: Medicaid Other | Admitting: Internal Medicine

## 2022-05-26 ENCOUNTER — Other Ambulatory Visit: Payer: Self-pay | Admitting: Internal Medicine

## 2022-05-26 MED ORDER — PHENTERMINE HCL 37.5 MG PO CAPS: 37.5000 mg | ORAL_CAPSULE | ORAL | 0 refills | Status: DC

## 2022-05-26 NOTE — Progress Notes (Deleted)
Subjective:    Patient ID: Pamela Schmidt, female    DOB: 10/22/1986, 36 y.o.   MRN: [id number]  HPI  Pt presents to the clinic today for 2 month follow up for weight check and med refill. She was started on Phentermine 5/5. Her starting weight was 222 lbs with a BMI of 41.97. Her weight today is lbs with a BMI of.  Review of Systems     Past Medical History:  Diagnosis Date   Anxiety    Chicken pox    History of herpes genitalis     Current Outpatient Medications  Medication Sig Dispense Refill   azelastine (ASTELIN) 0.1 % nasal spray Place 2 sprays into both nostrils 2 (two) times daily. Use in each nostril as directed 30 mL 12   diphenhydramine-acetaminophen (TYLENOL PM) 25-500 MG TABS tablet Take 1 tablet by mouth at bedtime as needed.     Ibuprofen 200 MG CAPS Take 800 capsules by mouth every 8 (eight) hours.     phentermine 37.5 MG capsule Take 1 capsule (37.5 mg total) by mouth every morning. 30 capsule 0   valACYclovir (VALTREX) 500 MG tablet Take 1 tablet (500 mg total) by mouth 2 (two) times daily as needed. 30 tablet 0   No current facility-administered medications for this visit.    Allergies  Allergen Reactions   Penicillins Itching and Rash    Did it involve swelling of the face/tongue/throat, SOB, or low BP? No Did it involve sudden or severe rash/hives, skin peeling, or any reaction on the inside of your mouth or nose? Yes Did you need to seek medical attention at a hospital or doctor's office? Unknown When did it last happen?      Unknown If all above answers are "NO", may proceed with cephalosporin use.     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 She lives at home with her children. She works as a Sales executive. She enjoys working outside in the yard and shopping.   Social Determinants of Health   Financial  Resource Strain: Medium Risk (03/11/2019)   Overall Financial Resource Strain (CARDIA)    Difficulty of Paying Living Expenses: Somewhat hard  Food Insecurity: No Food Insecurity (03/18/2019)   Hunger Vital Sign    Worried About Running Out of Food in the Last Year: Never true    Ran Out of Food in the Last Year: Never true  Transportation Needs: No Transportation Needs (03/11/2019)   PRAPARE - Administrator, Civil Service (Medical): No    Lack of Transportation (Non-Medical): No  Physical Activity: Inactive (03/18/2019)   Exercise Vital Sign    Days of Exercise per Week: 0 days    Minutes of Exercise per Session: 0 min  Stress: Stress Concern Present (03/18/2019)   [name] of Occupational Health - Occupational Stress Questionnaire    Feeling of Stress : To some extent  Social Connections: Unknown (03/11/2019)   Social Connection and Isolation Panel [NHANES]    Frequency of  Communication with Friends and Family: Three times a week    Frequency of Social Gatherings with Friends and Family: Once a week    Attends Religious Services: Not on Marketing executive or Organizations: Not on file    Attends Banker Meetings: Not on file    Marital Status: Divorced  Intimate Partner Violence: Not At Risk (03/11/2019)   Humiliation, Afraid, Rape, and Kick questionnaire    Fear of Current or Ex-Partner: No    Emotionally Abused: No    Physically Abused: No    Sexually Abused: No     Constitutional: Denies fever, malaise, fatigue, headache or abrupt weight changes.  HEENT: Denies eye pain, eye redness, ear pain, ringing in the ears, wax buildup, runny nose, nasal congestion, bloody nose, or sore throat. Respiratory: Denies difficulty breathing, shortness of breath, cough or sputum production.   Cardiovascular: Denies chest pain, chest tightness, palpitations or swelling in the hands or feet.  Gastrointestinal: Denies abdominal pain, bloating, constipation, diarrhea or blood in the stool.  GU: Denies urgency, frequency, pain with urination, burning sensation, blood in urine, odor or discharge. Musculoskeletal: Denies decrease in range of motion, difficulty with gait, muscle pain or joint pain and swelling.  Skin: Denies redness, rashes, lesions or ulcercations.  Neurological: Denies dizziness, difficulty with memory, difficulty with speech or problems with balance and coordination.  Psych: Denies anxiety, depression, SI/HI.  No other specific complaints in a complete review of systems (except as listed in HPI above).  Objective:   Physical Exam  There were no vitals taken for this visit. Wt Readings from Last 3 Encounters:  03/31/22 222 lb (100.7 kg)  04/09/21 180 lb (81.6 kg)  02/16/21 210 lb (95.3 kg)    General: Appears their stated age, well developed, well nourished in NAD. Skin: Warm, dry and intact. No rashes, lesions or ulcerations  noted. HEENT: Head: normal shape and size; Eyes: sclera white, no icterus, conjunctiva pink, PERRLA and EOMs intact; Ears: Tm's gray and intact, normal light reflex; Nose: mucosa pink and moist, septum midline; Throat/Mouth: Teeth present, mucosa pink and moist, no exudate, lesions or ulcerations noted.  Neck:  Neck supple, trachea midline. No masses, lumps or thyromegaly present.  Cardiovascular: Normal rate and rhythm. S1,S2 noted.  No murmur, rubs or gallops noted. No JVD or BLE edema. No carotid bruits noted. Pulmonary/Chest: Normal effort and positive vesicular breath sounds. No respiratory distress. No wheezes, rales or ronchi noted.  Abdomen: Soft and nontender. Normal bowel sounds.  No distention or masses noted. Liver, spleen and kidneys non palpable. Musculoskeletal: Normal range of motion. No signs of joint swelling. No difficulty with gait.  Neurological: Alert and oriented. Cranial nerves II-XII grossly intact. Coordination normal.  Psychiatric: Mood and affect normal. Behavior is normal. Judgment and thought content normal.    BMET    Component Value Date/Time   NA 139 03/31/2022 1354   K 4.1 03/31/2022 1354   CL 105 03/31/2022 1354   CO2 26 03/31/2022 1354   GLUCOSE 82 03/31/2022 1354   BUN 15 03/31/2022 1354   CREATININE 0.71 03/31/2022 1354   CALCIUM 8.3 (L) 03/31/2022 1354   GFRNONAA 113 03/19/2020 0855   GFRAA 131 03/19/2020 0855    Lipid Panel     Component Value Date/Time   CHOL 195 03/31/2022 1354   TRIG 161 (H) 03/31/2022 1354   HDL 61 03/31/2022 1354   CHOLHDL 3.2 03/31/2022 1354   LDLCALC 106 (H) 03/31/2022 1354    CBC    Component Value Date/Time   WBC 7.6 03/31/2022 1354   RBC 4.57 03/31/2022 1354   HGB 13.4 03/31/2022 1354   HGB 12.9 01/14/2019 1451   HCT 40.6 03/31/2022 1354   HCT 38.6 01/14/2019 1451   PLT 267 03/31/2022 1354   PLT 241 01/14/2019 1451   MCV 88.8 03/31/2022 1354   MCV 88 01/14/2019 1451   MCV 88 03/12/2014 0153   MCH 29.3  03/31/2022 1354   MCHC 33.0 03/31/2022 1354   RDW 11.9 03/31/2022 1354   RDW 11.9 01/14/2019 1451   RDW 12.5 03/12/2014 0153   LYMPHSABS 1,531 03/19/2020 0855   MONOABS 0.7 12/29/2018 1137   EOSABS 209 03/19/2020 0855   BASOSABS 52 03/19/2020 0855    Hgb A1C Lab Results  Component Value Date   HGBA1C 4.9 03/31/2022            Assessment & Plan:      Nicki Reaper, NP

## 2022-06-02 ENCOUNTER — Encounter: Payer: Self-pay | Admitting: Internal Medicine

## 2022-06-02 ENCOUNTER — Ambulatory Visit (INDEPENDENT_AMBULATORY_CARE_PROVIDER_SITE_OTHER): Payer: Medicaid Other | Admitting: Internal Medicine

## 2022-06-02 DIAGNOSIS — E7800 Pure hypercholesterolemia, unspecified: Secondary | ICD-10-CM | POA: Insufficient documentation

## 2022-06-02 MED ORDER — PHENTERMINE HCL 37.5 MG PO CAPS: 37.5000 mg | ORAL_CAPSULE | ORAL | 3 refills | Status: DC

## 2022-06-02 NOTE — Assessment & Plan Note (Signed)
Doing well on Phentermine, refilled today Reinforced low-carb diet and exercise for weight loss

## 2022-06-02 NOTE — Patient Instructions (Signed)

## 2022-06-02 NOTE — Progress Notes (Signed)
Subjective:    Patient ID: Pamela Schmidt, female    DOB: Mar 23, 1986, 36 y.o.   MRN: [id number]  HPI  Pt presents to the clinic today for follow up for weight check and med refill. She was started on Phentermine 03/2022. Her starting weight was 222 lbs with a BMI of 41.97. Her weight today is 210 lbs with a BMI of 39.68.  She does feel like the medication is effective however it does seem to wear off.  She was recently started trying to walk 1 mile daily.  Review of Systems  Past Medical History:  Diagnosis Date   Anxiety    Chicken pox    History of herpes genitalis     Current Outpatient Medications  Medication Sig Dispense Refill   azelastine (ASTELIN) 0.1 % nasal spray Place 2 sprays into both nostrils 2 (two) times daily. Use in each nostril as directed 30 mL 12   diphenhydramine-acetaminophen (TYLENOL PM) 25-500 MG TABS tablet Take 1 tablet by mouth at bedtime as needed.     Ibuprofen 200 MG CAPS Take 800 capsules by mouth every 8 (eight) hours.     phentermine 37.5 MG capsule Take 1 capsule (37.5 mg total) by mouth every morning. 30 capsule 0   valACYclovir (VALTREX) 500 MG tablet Take 1 tablet (500 mg total) by mouth 2 (two) times daily as needed. 30 tablet 0   No current facility-administered medications for this visit.    Allergies  Allergen Reactions   Penicillins Itching and Rash    Did it involve swelling of the face/tongue/throat, SOB, or low BP? No Did it involve sudden or severe rash/hives, skin peeling, or any reaction on the inside of your mouth or nose? Yes Did you need to seek medical attention at a hospital or doctor's office? Unknown When did it last happen?      Unknown If all above answers are "NO", may proceed with cephalosporin use.     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 She lives at home with her children.  She works as a Sales executive. She enjoys working outside in the yard and shopping.   Social Determinants of Health   Financial Resource Strain: Medium Risk (03/11/2019)   Overall Financial Resource Strain (CARDIA)    Difficulty of Paying Living Expenses: Somewhat hard  Food Insecurity: No Food Insecurity (03/18/2019)   Hunger Vital Sign    Worried About Running Out of Food in the Last Year: Never true    Ran Out of Food in the Last Year: Never true  Transportation Needs: No Transportation Needs (03/11/2019)   PRAPARE - Administrator, Civil Service (Medical): No    Lack of Transportation (Non-Medical): No  Physical Activity: Inactive (03/18/2019)   Exercise Vital Sign    Days of Exercise per Week: 0 days    Minutes of Exercise per Session: 0 min  Stress: Stress Concern Present (03/18/2019)   [name] of Occupational Health - Occupational Stress Questionnaire    Feeling  of Stress : To some extent  Social Connections: Unknown (03/11/2019)   Social Connection and Isolation Panel [NHANES]    Frequency of Communication with Friends and Family: Three times a week    Frequency of Social Gatherings with Friends and Family: Once a week    Attends Religious Services: Not on Marketing executive or Organizations: Not on file    Attends Banker Meetings: Not on file    Marital Status: Divorced  Intimate Partner Violence: Not At Risk (03/11/2019)   Humiliation, Afraid, Rape, and Kick questionnaire    Fear of Current or Ex-Partner: No    Emotionally Abused: No    Physically Abused: No    Sexually Abused: No     Constitutional: Denies fever, malaise, fatigue, headache or abrupt weight changes.  Respiratory: Denies difficulty breathing, shortness of breath, cough or sputum production.   Cardiovascular: Denies chest pain, chest tightness, palpitations or swelling in the hands or feet.  Neurological: Denies dizziness, difficulty with memory, difficulty with speech or problems with balance and coordination.    No other specific complaints in a complete review of systems (except as listed in HPI above).     Objective:   Physical Exam BP 132/84 (BP Location: Left Arm, Patient Position: Sitting, Cuff Size: Normal)   Pulse 86   Temp (!) 97.1 F (36.2 C) (Temporal)   Ht 5\' 1"  (1.549 m)   Wt 210 lb (95.3 kg)   SpO2 98%   BMI 39.68 kg/m   Wt Readings from Last 3 Encounters:  03/31/22 222 lb (100.7 kg)  04/09/21 180 lb (81.6 kg)  02/16/21 210 lb (95.3 kg)    General: Appears her stated age, obese, in NAD. Cardiovascular: Normal rate and rhythm. S1,S2 noted.  No murmur, rubs or gallops noted.  Pulmonary/Chest: Normal effort and positive vesicular breath sounds. No respiratory distress. No wheezes, rales or ronchi noted.  Neurological: Alert and oriented.   BMET    Component Value Date/Time   NA 139 03/31/2022 1354   K 4.1 03/31/2022  1354   CL 105 03/31/2022 1354   CO2 26 03/31/2022 1354   GLUCOSE 82 03/31/2022 1354   BUN 15 03/31/2022 1354   CREATININE 0.71 03/31/2022 1354   CALCIUM 8.3 (L) 03/31/2022 1354   GFRNONAA 113 03/19/2020 0855   GFRAA 131 03/19/2020 0855    Lipid Panel     Component Value Date/Time   CHOL 195 03/31/2022 1354   TRIG 161 (H) 03/31/2022 1354  HDL 61 03/31/2022 1354   CHOLHDL 3.2 03/31/2022 1354   LDLCALC 106 (H) 03/31/2022 1354    CBC    Component Value Date/Time   WBC 7.6 03/31/2022 1354   RBC 4.57 03/31/2022 1354   HGB 13.4 03/31/2022 1354   HGB 12.9 01/14/2019 1451   HCT 40.6 03/31/2022 1354   HCT 38.6 01/14/2019 1451   PLT 267 03/31/2022 1354   PLT 241 01/14/2019 1451   MCV 88.8 03/31/2022 1354   MCV 88 01/14/2019 1451   MCV 88 03/12/2014 0153   MCH 29.3 03/31/2022 1354   MCHC 33.0 03/31/2022 1354   RDW 11.9 03/31/2022 1354   RDW 11.9 01/14/2019 1451   RDW 12.5 03/12/2014 0153   LYMPHSABS 1,531 03/19/2020 0855   MONOABS 0.7 12/29/2018 1137   EOSABS 209 03/19/2020 0855   BASOSABS 52 03/19/2020 0855    Hgb A1C Lab Results  Component Value Date   HGBA1C 4.9 03/31/2022            Assessment & Plan:    RTC in 4 months for follow up chronic conditions Nicki Reaper, NP

## 2022-07-04 ENCOUNTER — Ambulatory Visit: Payer: Self-pay | Admitting: *Deleted

## 2022-07-04 NOTE — Telephone Encounter (Signed)
Summary: body aches, break out in sweats, low grade fever (99.00 - 100) congestion , cough.   Pt stated experiencing body aches, break out in sweats, low grade fever (99.00 - 100) congestion , cough. Pt mentioned this started about 2 days ago. Pt denied SOB and chest pain.    No appointments available.  Pt seeking clinical advice.        Chief Complaint: sinus facial pain requesting medication  Symptoms: sinus pain in face, body aches, sweats, fever 99, cough congestion . Not coughing any sputum up at this time. Nose stuffy no energy. Has not tested for covid. Other family members noted with same sx  Frequency: 2 days ago Pertinent Negatives: Patient denies chest pain no difficulty breathing. Disposition: [] ED /[x] Urgent Care (no appt availability in office) / [] Appointment(In office/virtual)/ [x]  [facility]/ [] Home Care/ [] Refused Recommended Disposition /[] [facility]/ []  Follow-up with PCP Additional Notes:   No available appt within 24 hours with PCP . Offered [facility] VV via My chart . Please advise if appt available or can give medication     Reason for Disposition  [1] Sinus pain (not just congestion) AND [2] fever  Answer Assessment - Initial Assessment Questions 1. LOCATION: "Where does it hurt?"      Face and teeth , all over body aches  2. ONSET: "When did the sinus pain start?"  (e.g., hours, days)      2 days ago  3. SEVERITY: "How bad is the pain?"   (Scale 1-10; mild, moderate or severe)   - MILD (1-3): doesn't interfere with normal activities    - MODERATE (4-7): interferes with normal activities (e.g., work or school) or awakens from sleep   - SEVERE (8-10): excruciating pain and patient unable to do any normal activities        Worsening pain  4. RECURRENT SYMPTOM: "Have you ever had sinus problems before?" If Yes, ask: "When was the last time?" and "What happened that time?"      na 5. NASAL CONGESTION: "Is the nose blocked?" If Yes, ask: "Can  you open it or must you breathe through your mouth?"     Stuffy tries to blow nose but nothing coming out can breath through mouth  6. NASAL DISCHARGE: "Do you have discharge from your nose?" If so ask, "What color?"     No  7. FEVER: "Do you have a fever?" If Yes, ask: "What is it, how was it measured, and when did it start?"      Yes 99 8. OTHER SYMPTOMS: "Do you have any other symptoms?" (e.g., sore throat, cough, earache, difficulty breathing)     Body aches, sweats fever cough congestion  9. PREGNANCY: "Is there any chance you are pregnant?" "When was your last menstrual period?"     na  Protocols used: Sinus Pain or Congestion-A-AH

## 2022-07-06 NOTE — Telephone Encounter (Signed)
LMTCB 07/06/2022.  PEC Please offer pt appointment for 07/07/2022 at 1pm or advise her to schedule a MyChart virtual visit.    Thanks,   -Vernona Rieger

## 2022-07-06 NOTE — Telephone Encounter (Signed)
I have no appointments available today.  I can see her tomorrow at 1 PM or she can do a virtual visit through MyChart with another provider.

## 2022-11-09 DIAGNOSIS — J069 Acute upper respiratory infection, unspecified: Secondary | ICD-10-CM | POA: Diagnosis not present

## 2022-12-22 ENCOUNTER — Encounter: Payer: Self-pay | Admitting: Internal Medicine

## 2022-12-22 ENCOUNTER — Ambulatory Visit (INDEPENDENT_AMBULATORY_CARE_PROVIDER_SITE_OTHER): Payer: Medicaid Other | Admitting: Internal Medicine

## 2022-12-22 VITALS — BP 126/70 | HR 80 | Temp 96.5°F | Wt 184.0 lb

## 2022-12-22 DIAGNOSIS — E7800 Pure hypercholesterolemia, unspecified: Secondary | ICD-10-CM | POA: Diagnosis not present

## 2022-12-22 DIAGNOSIS — E6609 Other obesity due to excess calories: Secondary | ICD-10-CM

## 2022-12-22 DIAGNOSIS — F419 Anxiety disorder, unspecified: Secondary | ICD-10-CM | POA: Diagnosis not present

## 2022-12-22 DIAGNOSIS — Z6834 Body mass index (BMI) 34.0-34.9, adult: Secondary | ICD-10-CM

## 2022-12-22 MED ORDER — BUPROPION HCL ER (XL) 150 MG PO TB24: 150.0000 mg | ORAL_TABLET | Freq: Every day | ORAL | 1 refills | Status: DC

## 2022-12-22 NOTE — Assessment & Plan Note (Signed)
Rx for bupropion 150 mg daily Support offered

## 2022-12-22 NOTE — Assessment & Plan Note (Signed)
Will continue phentermine for an additional 3 months Encourage diet and exercise for weight loss

## 2022-12-22 NOTE — Assessment & Plan Note (Signed)
C-Met and lipid profile today Encouraged her to consume low-fat diet 

## 2022-12-22 NOTE — Patient Instructions (Signed)
Calorie Counting for Weight Loss Calories are units of energy. Your body needs a certain number of calories from food to keep going throughout the day. When you eat or drink more calories than your body needs, your body stores the extra calories mostly as fat. When you eat or drink fewer calories than your body needs, your body burns fat to get the energy it needs. Calorie counting means keeping track of how many calories you eat and drink each day. Calorie counting can be helpful if you need to lose weight. If you eat fewer calories than your body needs, you should lose weight. Ask your health care provider what a healthy weight is for you. For calorie counting to work, you will need to eat the right number of calories each day to lose a healthy amount of weight per week. A dietitian can help you figure out how many calories you need in a day and will suggest ways to reach your calorie goal. A healthy amount of weight to lose each week is usually 1-2 lb (0.5-0.9 kg). This usually means that your daily calorie intake should be reduced by 500-750 calories. Eating 1,200-1,500 calories a day can help most women lose weight. Eating 1,500-1,800 calories a day can help most men lose weight. What do I need to know about calorie counting? Work with your health care provider or dietitian to determine how many calories you should get each day. To meet your daily calorie goal, you will need to: Find out how many calories are in each food that you would like to eat. Try to do this before you eat. Decide how much of the food you plan to eat. Keep a food log. Do this by writing down what you ate and how many calories it had. To successfully lose weight, it is important to balance calorie counting with a healthy lifestyle that includes regular activity. Where do I find calorie information?  The number of calories in a food can be found on a Nutrition Facts label. If a food does not have a Nutrition Facts label, try  to look up the calories online or ask your dietitian for help. Remember that calories are listed per serving. If you choose to have more than one serving of a food, you will have to multiply the calories per serving by the number of servings you plan to eat. For example, the label on a package of bread might say that a serving size is 1 slice and that there are 90 calories in a serving. If you eat 1 slice, you will have eaten 90 calories. If you eat 2 slices, you will have eaten 180 calories. How do I keep a food log? After each time that you eat, record the following in your food log as soon as possible: What you ate. Be sure to include toppings, sauces, and other extras on the food. How much you ate. This can be measured in cups, ounces, or number of items. How many calories were in each food and drink. The total number of calories in the food you ate. Keep your food log near you, such as in a pocket-sized notebook or on an app or website on your mobile phone. Some programs will calculate calories for you and show you how many calories you have left to meet your daily goal. What are some portion-control tips? Know how many calories are in a serving. This will help you know how many servings you can have of a certain   food. Use a measuring cup to measure serving sizes. You could also try weighing out portions on a kitchen scale. With time, you will be able to estimate serving sizes for some foods. Take time to put servings of different foods on your favorite plates or in your favorite bowls and cups so you know what a serving looks like. Try not to eat straight from a food's packaging, such as from a bag or box. Eating straight from the package makes it hard to see how much you are eating and can lead to overeating. Put the amount you would like to eat in a cup or on a plate to make sure you are eating the right portion. Use smaller plates, glasses, and bowls for smaller portions and to prevent  overeating. Try not to multitask. For example, avoid watching TV or using your computer while eating. If it is time to eat, sit down at a table and enjoy your food. This will help you recognize when you are full. It will also help you be more mindful of what and how much you are eating. What are tips for following this plan? Reading food labels Check the calorie count compared with the serving size. The serving size may be smaller than what you are used to eating. Check the source of the calories. Try to choose foods that are high in protein, fiber, and vitamins, and low in saturated fat, trans fat, and sodium. Shopping Read nutrition labels while you shop. This will help you make healthy decisions about which foods to buy. Pay attention to nutrition labels for low-fat or fat-free foods. These foods sometimes have the same number of calories or more calories than the full-fat versions. They also often have added sugar, starch, or salt to make up for flavor that was removed with the fat. Make a grocery list of lower-calorie foods and stick to it. Cooking Try to cook your favorite foods in a healthier way. For example, try baking instead of frying. Use low-fat dairy products. Meal planning Use more fruits and vegetables. One-half of your plate should be fruits and vegetables. Include lean proteins, such as chicken, turkey, and fish. Lifestyle Each week, aim to do one of the following: 150 minutes of moderate exercise, such as walking. 75 minutes of vigorous exercise, such as running. General information Know how many calories are in the foods you eat most often. This will help you calculate calorie counts faster. Find a way of tracking calories that works for you. Get creative. Try different apps or programs if writing down calories does not work for you. What foods should I eat?  Eat nutritious foods. It is better to have a nutritious, high-calorie food, such as an avocado, than a food with  few nutrients, such as a bag of potato chips. Use your calories on foods and drinks that will fill you up and will not leave you hungry soon after eating. Examples of foods that fill you up are nuts and nut butters, vegetables, lean proteins, and high-fiber foods such as whole grains. High-fiber foods are foods with more than 5 g of fiber per serving. Pay attention to calories in drinks. Low-calorie drinks include water and unsweetened drinks. The items listed above may not be a complete list of foods and beverages you can eat. Contact a dietitian for more information. What foods should I limit? Limit foods or drinks that are not good sources of vitamins, minerals, or protein or that are high in unhealthy fats. These   include: Candy. Other sweets. Sodas, specialty coffee drinks, alcohol, and juice. The items listed above may not be a complete list of foods and beverages you should avoid. Contact a dietitian for more information. How do I count calories when eating out? Pay attention to portions. Often, portions are much larger when eating out. Try these tips to keep portions smaller: Consider sharing a meal instead of getting your own. If you get your own meal, eat only half of it. Before you start eating, ask for a container and put half of your meal into it. When available, consider ordering smaller portions from the menu instead of full portions. Pay attention to your food and drink choices. Knowing the way food is cooked and what is included with the meal can help you eat fewer calories. If calories are listed on the menu, choose the lower-calorie options. Choose dishes that include vegetables, fruits, whole grains, low-fat dairy products, and lean proteins. Choose items that are boiled, broiled, grilled, or steamed. Avoid items that are buttered, battered, fried, or served with cream sauce. Items labeled as crispy are usually fried, unless stated otherwise. Choose water, low-fat milk,  unsweetened iced tea, or other drinks without added sugar. If you want an alcoholic beverage, choose a lower-calorie option, such as a glass of wine or light beer. Ask for dressings, sauces, and syrups on the side. These are usually high in calories, so you should limit the amount you eat. If you want a salad, choose a garden salad and ask for grilled meats. Avoid extra toppings such as bacon, cheese, or fried items. Ask for the dressing on the side, or ask for olive oil and vinegar or lemon to use as dressing. Estimate how many servings of a food you are given. Knowing serving sizes will help you be aware of how much food you are eating at restaurants. Where to find more information Centers for Disease Control and Prevention: www.cdc.gov U.[place]. Department of Agriculture: myplate.gov Summary Calorie counting means keeping track of how many calories you eat and drink each day. If you eat fewer calories than your body needs, you should lose weight. A healthy amount of weight to lose per week is usually 1-2 lb (0.5-0.9 kg). This usually means reducing your daily calorie intake by 500-750 calories. The number of calories in a food can be found on a Nutrition Facts label. If a food does not have a Nutrition Facts label, try to look up the calories online or ask your dietitian for help. Use smaller plates, glasses, and bowls for smaller portions and to prevent overeating. Use your calories on foods and drinks that will fill you up and not leave you hungry shortly after a meal. This information is not intended to replace advice given to you by your health care provider. Make sure you discuss any questions you have with your health care provider. Document Revised: 12/25/2019 Document Reviewed: 12/25/2019 Elsevier Patient Education  2023 Elsevier Inc.  

## 2022-12-22 NOTE — Progress Notes (Signed)
Subjective:    Patient ID: Pamela [place], female    DOB: July 05, 1986, 37 y.o.   MRN: [id number]  HPI  Patient presents to clinic today for follow-up of chronic conditions.  GAD: Persistent issues secondary to work related stress. She is irritable, has trouble falling and staying asleep. She feels overwhelmed. She has been on Sertraline and Buspirone in the past but did not like the way it made her feel. She is not currently seeing a therapist. She denies depression, SI/HI.  HLD: Her last LDL was 106, triglycerides 161, 03/2022.  She is not taking any cholesterol-lowering medication this time.  She tries to consume low-fat diet.  She would also like a refill on Phentermine.  Her current weight is 184 LBS with a BMI of 34.77. Her starting weight was 222 lbs with a BMI of 41.97   Review of Systems     Past Medical History:  Diagnosis Date   Anxiety    Chicken pox    History of herpes genitalis     Current Outpatient Medications  Medication Sig Dispense Refill   azelastine (ASTELIN) 0.1 % nasal spray Place 2 sprays into both nostrils 2 (two) times daily. Use in each nostril as directed 30 mL 12   diphenhydramine-acetaminophen (TYLENOL PM) 25-500 MG TABS tablet Take 1 tablet by mouth at bedtime as needed.     Ibuprofen 200 MG CAPS Take 800 capsules by mouth every 8 (eight) hours.     phentermine 37.5 MG capsule Take 1 capsule (37.5 mg total) by mouth every morning. 30 capsule 3   valACYclovir (VALTREX) 500 MG tablet Take 1 tablet (500 mg total) by mouth 2 (two) times daily as needed. 30 tablet 0   No current facility-administered medications for this visit.    Allergies  Allergen Reactions   Penicillins Itching and Rash    Did it involve swelling of the face/tongue/throat, SOB, or low BP? No Did it involve sudden or severe rash/hives, skin peeling, or any reaction on the inside of your mouth or nose? Yes Did you need to seek medical attention at a hospital or doctor's office?  Unknown When did it last happen?      Unknown If all above answers are "NO", may proceed with cephalosporin use.     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    Comment: [place], [place]  Tobacco Use   Smoking status: Former    Types: Cigarettes    Quit date: 01/30/2010    Years since quitting: 12.9   Smokeless tobacco: Never  Vaping Use   Vaping Use: Never used  Substance and Sexual Activity   Alcohol use: Yes    Alcohol/week: 2.0 standard drinks of alcohol    Types: 2 Glasses of wine per week    Comment: occasional   Drug use: No  Sexual activity: Yes    Partners: Male    Birth control/protection: None  Other Topics Concern   Not on file  Social History Narrative   [place] and then moved to [place] area when she was in middle school. She lives at home with her children. She works as a Art therapist. She enjoys working outside in the yard and shopping.   Social Determinants of Health   Financial Resource Strain: Medium Risk (03/11/2019)   Overall Financial Resource Strain (CARDIA)    Difficulty of Paying Living Expenses: Somewhat hard  Food Insecurity: No Food Insecurity (03/18/2019)   Hunger Vital Sign    Worried About Running Out of Food in the Last Year: Never true    Ran Out of Food in the Last Year: Never true  Transportation Needs: No Transportation Needs (03/11/2019)   PRAPARE - Hydrologist  (Medical): No    Lack of Transportation (Non-Medical): No  Physical Activity: Inactive (03/18/2019)   Exercise Vital Sign    Days of Exercise per Week: 0 days    Minutes of Exercise per Session: 0 min  Stress: Stress Concern Present (03/18/2019)   Green    Feeling of Stress : To some extent  Social Connections: Unknown (03/11/2019)   Social Connection and Isolation Panel [NHANES]    Frequency of Communication with Friends and Family: Three times a week    Frequency of Social Gatherings with Friends and Family: Once a week    Attends Religious Services: Not on Advertising copywriter or Organizations: Not on file    Attends Archivist Meetings: Not on file    Marital Status: Divorced  Intimate Partner Violence: Not At Risk (03/11/2019)   Humiliation, Afraid, Rape, and Kick questionnaire    Fear of Current or Ex-Partner: No    Emotionally Abused: No    Physically Abused: No    Sexually Abused: No     Constitutional: Denies fever, malaise, fatigue, headache or abrupt weight changes.  HEENT: Denies eye pain, eye redness, ear pain, ringing in the ears, wax buildup, runny nose, nasal congestion, bloody nose, or sore throat. Respiratory: Denies difficulty breathing, shortness of breath, cough or sputum production.   Cardiovascular: Denies chest pain, chest tightness, palpitations or swelling in the hands or feet.  Gastrointestinal: Denies abdominal pain, bloating, constipation, diarrhea or blood in the stool.  GU: Denies urgency, frequency, pain with urination, burning sensation, blood in urine, odor or discharge. Musculoskeletal: Denies decrease in range of motion, difficulty with gait, muscle pain or joint pain and swelling.  Skin: Denies redness, rashes, lesions or ulcercations.  Neurological: Denies dizziness, difficulty with memory, difficulty with speech or problems with balance and coordination.   Psych: Patient has a history of anxiety.  Denies depression, SI/HI.  No other specific complaints in a complete review of systems (except as listed in HPI above).  Objective:   Physical Exam   BP 126/70 (BP Location: Right Arm, Patient Position: Sitting, Cuff Size: Normal)   Pulse 80   Temp (!) 96.5 F (35.8 C) (Temporal)   Wt 184 lb (83.5 kg)   SpO2 97%   BMI 34.77 kg/m   Wt Readings from Last 3 Encounters:  06/02/22 210 lb (95.3 kg)  03/31/22 222 lb (100.7 kg)  04/09/21 180 lb (81.6 kg)    General: Appears her stated age, obese, in NAD.  Skin: Warm, dry and intact.  HEENT: Head: normal shape and size; Eyes: sclera white, no icterus, conjunctiva pink, PERRLA and EOMs intact;  Cardiovascular: Normal rate and rhythm. S1,S2 noted.  No murmur, rubs or gallops noted.  Pulmonary/Chest: Normal effort and positive vesicular breath sounds. No respiratory distress. No wheezes, rales or ronchi noted.  Musculoskeletal: No difficulty with gait.  Neurological: Alert and oriented.  Psychiatric: Mood and affect normal.  Mildly anxious appearing. Judgment and thought content normal.   BMET    Component Value Date/Time   NA 139 03/31/2022 1354   K 4.1 03/31/2022 1354   CL 105 03/31/2022 1354   CO2 26 03/31/2022 1354   GLUCOSE 82 03/31/2022 1354   BUN 15 03/31/2022 1354   CREATININE 0.71 03/31/2022 1354   CALCIUM 8.3 (L) 03/31/2022 1354   GFRNONAA 113 03/19/2020 0855   GFRAA 131 03/19/2020 0855    Lipid Panel     Component Value Date/Time   CHOL 195 03/31/2022 1354   TRIG 161 (H) 03/31/2022 1354   HDL 61 03/31/2022 1354   CHOLHDL 3.2 03/31/2022 1354   LDLCALC 106 (H) 03/31/2022 1354    CBC    Component Value Date/Time   WBC 7.6 03/31/2022 1354   RBC 4.57 03/31/2022 1354   HGB 13.4 03/31/2022 1354   HGB 12.9 01/14/2019 1451   HCT 40.6 03/31/2022 1354   HCT 38.6 01/14/2019 1451   PLT 267 03/31/2022 1354   PLT 241 01/14/2019 1451   MCV 88.8 03/31/2022 1354   MCV 88  01/14/2019 1451   MCV 88 03/12/2014 0153   MCH 29.3 03/31/2022 1354   MCHC 33.0 03/31/2022 1354   RDW 11.9 03/31/2022 1354   RDW 11.9 01/14/2019 1451   RDW 12.5 03/12/2014 0153   LYMPHSABS 1,531 03/19/2020 0855   MONOABS 0.7 12/29/2018 1137   EOSABS 209 03/19/2020 0855   BASOSABS 52 03/19/2020 0855    Hgb A1C Lab Results  Component Value Date   HGBA1C 4.9 03/31/2022          Assessment & Plan:     RTC in 6 months for annual exam Nicki Reaper, NP

## 2022-12-23 LAB — COMPLETE METABOLIC PANEL WITH GFR
AG Ratio: 1.7 (calc) (ref 1.0–2.5)
ALT: 16 U/L (ref 6–29)
AST: 16 U/L (ref 10–30)
Albumin: 3.9 g/dL (ref 3.6–5.1)
Alkaline phosphatase (APISO): 65 U/L (ref 31–125)
BUN: 15 mg/dL (ref 7–25)
CO2: 28 mmol/L (ref 20–32)
Calcium: 8.6 mg/dL (ref 8.6–10.2)
Chloride: 105 mmol/L (ref 98–110)
Creat: 0.55 mg/dL (ref 0.50–0.97)
Globulin: 2.3 g/dL (calc) (ref 1.9–3.7)
Glucose, Bld: 82 mg/dL (ref 65–99)
Potassium: 4.5 mmol/L (ref 3.5–5.3)
Sodium: 140 mmol/L (ref 135–146)
Total Bilirubin: 0.6 mg/dL (ref 0.2–1.2)
Total Protein: 6.2 g/dL (ref 6.1–8.1)
eGFR: 120 mL/min/{1.73_m2} (ref >=60)

## 2022-12-23 LAB — LIPID PANEL
Cholesterol: 165 mg/dL (ref <200)
HDL: 67 mg/dL (ref >=50)
LDL Cholesterol (Calc): 85 mg/dL (calc)
Non-HDL Cholesterol (Calc): 98 mg/dL (calc) (ref <130)
Total CHOL/HDL Ratio: 2.5 (calc) (ref <5.0)
Triglycerides: 44 mg/dL (ref <150)

## 2023-01-31 ENCOUNTER — Other Ambulatory Visit: Payer: Self-pay | Admitting: Internal Medicine

## 2023-01-31 NOTE — Telephone Encounter (Signed)
Requested medication (s) are due for refill today: yes  Requested medication (s) are on the active medication list: yes  Last refill:  06/02/22 #30/3  Future visit scheduled: yes  Notes to clinic:  Unable to refill per protocol, cannot delegate.    Requested Prescriptions  Pending Prescriptions Disp Refills   phentermine (ADIPEX-P) 37.5 MG tablet [Pharmacy Med Name: PHENTERMINE 37.'5MG'$  TABLETS] 30 tablet     Sig: TAKE 1 TABLET(37.5 MG) BY MOUTH EVERY MORNING     Not Delegated - Neurology: Anticonvulsants - Controlled - phentermine hydrochloride Failed - 01/31/2023  7:50 AM      Failed - This refill cannot be delegated      Passed - eGFR in normal range and within 360 days    GFR, Est African American  Date Value Ref Range Status  03/19/2020 131 > OR = 60 mL/min/1.63m Final   GFR, Est Non African American  Date Value Ref Range Status  03/19/2020 113 > OR = 60 mL/min/1.768mFinal   GFR  Date Value Ref Range Status  01/30/2014 127.13 >60.00 mL/min Final   eGFR  Date Value Ref Range Status  12/22/2022 120 > OR = 60 mL/min/1.7316minal         Passed - Cr in normal range and within 360 days    Creat  Date Value Ref Range Status  12/22/2022 0.55 0.50 - 0.97 mg/dL Final         Passed - Last BP in normal range    BP Readings from Last 1 Encounters:  12/22/22 126/70         Passed - Valid encounter within last 6 months    Recent Outpatient Visits           1 month ago [facility]ole[facility]   [facility]   10 months ago Encounter for general adult medical examination with abnormal findings   [facility]   1 year ago Acute cystitis with hematuria   [facility]   2 years ago Acute non-recurrent sinusitis, unspecified  location   [facility]       Future Appointments             In 4 months Baity, [facility] completed in the last 3 months    Wt Readings from Last 1 Encounters:  12/22/22 184 lb (83.5 kg)

## 2023-02-15 ENCOUNTER — Ambulatory Visit: Payer: Self-pay | Admitting: *Deleted

## 2023-02-15 NOTE — Telephone Encounter (Signed)
Summary: Per agent:  "Possible Pink Eye, Rx request   Pt has been exposed to son that is positive for pink eye, she now has clear symptoms and wants a Rx for eye drops. Prefers not to have a visit but is willing if needed.  [facility] [place]."         Chief Complaint: Eye Drainage Symptoms: Yellowish eye drainage, both eyes, sclera red, mild "Puffiness" of lids. "Feels like gravel."  States son has pink eye Dx today. Frequency: today Pertinent Negatives: Patient denies  Disposition: [] ED /[] Urgent Care (no appt availability in office) / [] Appointment(In office/virtual)/ []  [place] [facility]/ [] Home Care/ [] Refused Recommended Disposition /[] [place] [facility]/ [x]  Follow-up with PCP Additional Notes: Pt requesting drops called in. States will come in for appt if necessary "But exactly what my son has."  Care advise provided, verbalizes understanding. If appropriate, [facility] in [place] may need appt. Please advise.  Reason for Disposition  [1] Eye with yellow or green discharge, or eyelashes stick together AND [2] NO PCP standing order to call in antibiotic eye drops   (Exception: [place]; continue triage.)  Answer Assessment - Initial Assessment Questions 1. EYE DISCHARGE: "Is the discharge in one or both eyes?" "What color is it?" "How much is there?" "When did the discharge start?"      Both 2. REDNESS OF SCLERA: "Is the redness in one or both eyes?" "When did the redness start?"      Red 3. EYELIDS: "Are the eyelids red or swollen?" If Yes, ask: "How much?"      Mild swelling "A little" 4. VISION: "Is there any difficulty seeing clearly?"      no 5. PAIN: "Is there any pain? If Yes, ask: "How bad is it?" (Scale 1-10; or mild, moderate, severe)    - MILD (1-3): doesn't interfere with normal activities     - MODERATE (4-7): interferes with normal activities or awakens from sleep    - SEVERE (8-10): excruciating pain, unable to do any normal activities        Uncomfortable "Gravel" 6. CONTACT LENS: "Do you wear contacts?"     Yes 7. OTHER SYMPTOMS: "Do you have any other symptoms?" (e.g., fever, runny nose, cough)     No  Protocols used: Eye - Pus or Discharge-A-AH

## 2023-02-16 ENCOUNTER — Ambulatory Visit: Payer: Medicaid Other | Admitting: Internal Medicine

## 2023-02-16 NOTE — Telephone Encounter (Signed)
Pt schedule for 02/16/2023 at 840

## 2023-03-09 ENCOUNTER — Other Ambulatory Visit: Payer: Self-pay | Admitting: Internal Medicine

## 2023-03-12 ENCOUNTER — Encounter: Payer: Self-pay | Admitting: Internal Medicine

## 2023-03-12 NOTE — Telephone Encounter (Signed)
Requested medication (s) are due for refill today: yes  Requested medication (s) are on the active medication list: yes  Last refill:  02/01/23  Future visit scheduled: yes  Notes to clinic:  Unable to refill per protocol, cannot delegate.      Requested Prescriptions  Pending Prescriptions Disp Refills   phentermine (ADIPEX-P) 37.5 MG tablet [Pharmacy Med Name: PHENTERMINE 37.5MG  TABLETS] 30 tablet     Sig: TAKE 1 TABLET(37.5 MG) BY MOUTH EVERY MORNING     Not Delegated - Neurology: Anticonvulsants - Controlled - phentermine hydrochloride Failed - 03/09/2023  5:35 PM      Failed - This refill cannot be delegated      Passed - eGFR in normal range and within 360 days    GFR, Est African American  Date Value Ref Range Status  03/19/2020 131 > OR = 60 mL/min/1.38m2 Final   GFR, Est Non African American  Date Value Ref Range Status  03/19/2020 113 > OR = 60 mL/min/1.91m2 Final   GFR  Date Value Ref Range Status  01/30/2014 127.13 >60.00 mL/min Final   eGFR  Date Value Ref Range Status  12/22/2022 120 > OR = 60 mL/min/1.53m2 Final         Passed - Cr in normal range and within 360 days    Creat  Date Value Ref Range Status  12/22/2022 0.55 0.50 - 0.97 mg/dL Final         Passed - Last BP in normal range    BP Readings from Last 1 Encounters:  12/22/22 126/70         Passed - Valid encounter within last 6 months    Recent Outpatient Visits           2 months ago Pure hypercholesterolemia   [place] [facility] [place], Salvadore Oxford, NP   9 months ago Morbid obesity [facility])   [place] [facility] [place], Salvadore Oxford, NP   11 months ago Encounter for general adult medical examination with abnormal findings   [place] [facility] [place], Salvadore Oxford, NP   2 years ago Acute cystitis with hematuria   [facility] Smitty Cords, DO   2 years ago Acute non-recurrent sinusitis, unspecified  location   [facility], Jodelle Gross, FNP       Future Appointments             In 3 months Baity, Salvadore Oxford, NP [place] [facility]            Passed - Weight completed in the last 3 months    Wt Readings from Last 1 Encounters:  12/22/22 184 lb (83.5 kg)

## 2023-03-26 MED ORDER — PHENTERMINE HCL 37.5 MG PO TABS: ORAL_TABLET | ORAL | 0 refills | Status: DC

## 2023-04-16 ENCOUNTER — Other Ambulatory Visit (HOSPITAL_COMMUNITY)
Admission: RE | Admit: 2023-04-16 | Discharge: 2023-04-16 | Disposition: A | Discharge status: Home / Self Care | Payer: Medicaid Other | Source: Ambulatory Visit | Attending: Internal Medicine | Admitting: Internal Medicine

## 2023-04-16 ENCOUNTER — Ambulatory Visit (INDEPENDENT_AMBULATORY_CARE_PROVIDER_SITE_OTHER): Payer: Medicaid Other | Admitting: Internal Medicine

## 2023-04-16 ENCOUNTER — Encounter: Payer: Self-pay | Admitting: Internal Medicine

## 2023-04-16 VITALS — BP 114/72 | HR 78 | Temp 96.7°F | Wt 175.0 lb

## 2023-04-16 DIAGNOSIS — N898 Other specified noninflammatory disorders of vagina: Secondary | ICD-10-CM | POA: Insufficient documentation

## 2023-04-16 DIAGNOSIS — Z0001 Encounter for general adult medical examination with abnormal findings: Secondary | ICD-10-CM | POA: Diagnosis not present

## 2023-04-16 DIAGNOSIS — R7309 Other abnormal glucose: Secondary | ICD-10-CM | POA: Diagnosis not present

## 2023-04-16 DIAGNOSIS — F419 Anxiety disorder, unspecified: Secondary | ICD-10-CM

## 2023-04-16 DIAGNOSIS — E6609 Other obesity due to excess calories: Secondary | ICD-10-CM | POA: Diagnosis not present

## 2023-04-16 DIAGNOSIS — Z6833 Body mass index (BMI) 33.0-33.9, adult: Secondary | ICD-10-CM | POA: Diagnosis not present

## 2023-04-16 MED ORDER — FLUCONAZOLE 150 MG PO TABS: 150.0000 mg | ORAL_TABLET | Freq: Once | ORAL | 0 refills | Status: AC

## 2023-04-16 NOTE — Patient Instructions (Signed)

## 2023-04-16 NOTE — Assessment & Plan Note (Signed)
Encourage diet and exercise for weight loss 

## 2023-04-16 NOTE — Assessment & Plan Note (Signed)
Stable on current dose of bupropion Support offered

## 2023-04-16 NOTE — Progress Notes (Signed)
Subjective:    Patient ID: Pamela Schmidt, female    DOB: 11-27-1986, 37 y.o.   MRN: [id number]  HPI  Patient presents to clinic today for her annual exam. Pt reports vaginal discharge and itching. She reports this started a few days ago. She reports the discharge is white without odor. She does not douche or take bubble baths. She has not tried anything OTC for this.  She is also due to follow-up anxiety. This is currently managed on Bupropion.  She is not currently seeing a therapist.  She denies SI/HI.  Flu: 07/2014 Tetanus: 07/2014 COVID: never Pap smear: 01/2019 Dentist: biannually  Diet: She does eat meat. She consumes fruits and veggies. She does eat fried foods. She drinks mostly water, Dt. Dr. Reino Kent. Exercise: cardio, lifting weights.  Review of Systems     Past Medical History:  Diagnosis Date   Anxiety    Chicken pox    History of herpes genitalis     Current Outpatient Medications  Medication Sig Dispense Refill   buPROPion (WELLBUTRIN XL) 150 MG 24 hr tablet Take 1 tablet (150 mg total) by mouth daily. 90 tablet 1   phentermine (ADIPEX-P) 37.5 MG tablet TAKE 1 TABLET(37.5 MG) BY MOUTH EVERY MORNING 30 tablet 0   valACYclovir (VALTREX) 500 MG tablet Take 1 tablet (500 mg total) by mouth 2 (two) times daily as needed. 30 tablet 0   No current facility-administered medications for this visit.    Allergies  Allergen Reactions   Penicillins Itching and Rash    Did it involve swelling of the face/tongue/throat, SOB, or low BP? No Did it involve sudden or severe rash/hives, skin peeling, or any reaction on the inside of your mouth or nose? Yes Did you need to seek medical attention at a hospital or doctor's office? Unknown When did it last happen?      Unknown If all above answers are "NO", may proceed with cephalosporin use.     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 She lives at home with  her children. She works as a Sales executive. She enjoys working outside in the yard and shopping.   Social Determinants of Health   Financial Resource Strain: Medium Risk (03/11/2019)   Overall Financial Resource Strain (CARDIA)    Difficulty of Paying Living Expenses: Somewhat hard  Food Insecurity: No Food Insecurity (03/18/2019)   Hunger Vital Sign    Worried About Running Out of Food in the Last Year: Never true    Ran Out of Food in the Last Year: Never true  Transportation Needs: No Transportation Needs (03/11/2019)   PRAPARE - Administrator, Civil Service (Medical): No    Lack of Transportation (Non-Medical): No  Physical Activity: Inactive (03/18/2019)   Exercise Vital Sign    Days of Exercise per Week: 0 days    Minutes of Exercise per Session: 0 min  Stress: Stress Concern Present (03/18/2019)    [name] of Occupational Health - Occupational Stress Questionnaire    Feeling of Stress : To some extent  Social Connections: Unknown (03/11/2019)   Social Connection and Isolation Panel [NHANES]    Frequency of Communication with Friends and Family: Three times a week    Frequency of Social Gatherings with Friends and Family: Once a week    Attends Religious Services: Not on Marketing executive or Organizations: Not on file    Attends Banker Meetings: Not on file    Marital Status: Divorced  Intimate Partner Violence: Not At Risk (03/11/2019)   Humiliation, Afraid, Rape, and Kick questionnaire    Fear of Current or Ex-Partner: No    Emotionally Abused: No    Physically Abused: No    Sexually Abused: No     Constitutional: Denies fever, malaise, fatigue, headache or abrupt weight changes.  HEENT: Denies eye pain, eye redness, ear pain, ringing in the ears, wax buildup, runny nose, nasal congestion, bloody nose, or sore throat. Respiratory: Denies difficulty breathing, shortness of breath, cough or sputum production.   Cardiovascular: Denies chest pain, chest tightness, palpitations or swelling in the hands or feet.  Gastrointestinal: Denies abdominal pain, bloating, constipation, diarrhea or blood in the stool.  GU: Patient reports vaginal itching and discharge.  Denies urgency, frequency, pain with urination, burning sensation, blood in urine, odor. Musculoskeletal: Denies decrease in range of motion, difficulty with gait, muscle pain or joint pain and swelling.  Skin: Denies redness, rashes, lesions or ulcercations.  Neurological: Denies dizziness, difficulty with memory, difficulty with speech or problems with balance and coordination.  Psych: Pt has a history of anxiety. Denies depression, SI/HI.  No other specific complaints in a complete review of systems (except as listed in HPI above).  Objective:   Physical Exam   BP 114/72 (BP Location:  Left Arm, Patient Position: Sitting, Cuff Size: Normal)   Pulse 78   Temp (!) 96.7 F (35.9 C) (Temporal)   Wt 175 lb (79.4 kg)   SpO2 98%   BMI 33.07 kg/m   Wt Readings from Last 3 Encounters:  12/22/22 184 lb (83.5 kg)  06/02/22 210 lb (95.3 kg)  03/31/22 222 lb (100.7 kg)    General: Appears her stated age, obese in NAD. Skin: Warm, dry and intact.  HEENT: Head: normal shape and size; Eyes: sclera white, no icterus, conjunctiva pink, PERRLA and EOMs intact; Neck:  Neck supple, trachea midline. No masses, lumps or thyromegaly present.  Cardiovascular: Normal rate and rhythm. S1,S2 noted.  No murmur, rubs or gallops  noted. No JVD or BLE edema.  Pulmonary/Chest: Normal effort and positive vesicular breath sounds. No respiratory distress. No wheezes, rales or ronchi noted.  Abdomen:  Normal bowel sounds.  Pelvic: Self swab. Musculoskeletal: Strength 5/5 BUE/BLE.  No difficulty with gait.  Neurological: Alert and oriented. Cranial nerves II-XII grossly intact. Coordination normal.  Psychiatric: Mood and affect normal. Behavior is normal. Judgment and thought content normal.    BMET    Component Value Date/Time   NA 140 12/22/2022 0901   K 4.5 12/22/2022 0901   CL 105 12/22/2022 0901   CO2 28 12/22/2022 0901   GLUCOSE 82 12/22/2022 0901   BUN 15 12/22/2022 0901   CREATININE 0.55 12/22/2022 0901   CALCIUM 8.6 12/22/2022 0901   GFRNONAA 113 03/19/2020 0855   GFRAA 131 03/19/2020 0855    Lipid Panel     Component Value Date/Time   CHOL 165 12/22/2022 0901   TRIG 44 12/22/2022 0901   HDL 67 12/22/2022 0901   CHOLHDL 2.5 12/22/2022 0901   LDLCALC 85 12/22/2022 0901    CBC    Component Value Date/Time   WBC 7.6 03/31/2022 1354   RBC 4.57 03/31/2022 1354   HGB 13.4 03/31/2022 1354   HGB 12.9 01/14/2019 1451   HCT 40.6 03/31/2022 1354   HCT 38.6 01/14/2019 1451   PLT 267 03/31/2022 1354   PLT 241 01/14/2019 1451   MCV 88.8 03/31/2022 1354   MCV 88 01/14/2019 1451    MCV 88 03/12/2014 0153   MCH 29.3 03/31/2022 1354   MCHC 33.0 03/31/2022 1354   RDW 11.9 03/31/2022 1354   RDW 11.9 01/14/2019 1451   RDW 12.5 03/12/2014 0153   LYMPHSABS 1,531 03/19/2020 0855   MONOABS 0.7 12/29/2018 1137   EOSABS 209 03/19/2020 0855   BASOSABS 52 03/19/2020 0855    Hgb A1C Lab Results  Component Value Date   HGBA1C 4.9 03/31/2022           Assessment & Plan:   Preventative Health Maintenance:  Encouraged her to get a flu shot in the fall Tetanus UTD Encouraged her to get her COVID-vaccine Pap smear UTD Encouraged to consume a balanced diet and exercise regimen Advised her to see a dentist annually We will check CBC, c-Met, lipid, A1c today  Vaginal Discharge, Vaginal Itching:  Will check wet prep for BV and yeast Rx for Diflucan 150 mg p.o. x 1  RTC in 1 years, for your annual exam Nicki Reaper, NP

## 2023-04-17 ENCOUNTER — Ambulatory Visit: Payer: Medicaid Other | Admitting: Internal Medicine

## 2023-04-17 LAB — LIPID PANEL
Cholesterol: 171 mg/dL (ref <200)
HDL: 71 mg/dL (ref >=50)
LDL Cholesterol (Calc): 84 mg/dL (calc)
Non-HDL Cholesterol (Calc): 100 mg/dL (calc) (ref <130)
Total CHOL/HDL Ratio: 2.4 (calc) (ref <5.0)
Triglycerides: 72 mg/dL (ref <150)

## 2023-04-17 LAB — COMPLETE METABOLIC PANEL WITH GFR
AG Ratio: 1.6 (calc) (ref 1.0–2.5)
ALT: 10 U/L (ref 6–29)
AST: 12 U/L (ref 10–30)
Albumin: 4.1 g/dL (ref 3.6–5.1)
Alkaline phosphatase (APISO): 67 U/L (ref 31–125)
BUN: 19 mg/dL (ref 7–25)
CO2: 30 mmol/L (ref 20–32)
Calcium: 9.1 mg/dL (ref 8.6–10.2)
Chloride: 103 mmol/L (ref 98–110)
Creat: 0.79 mg/dL (ref 0.50–0.97)
Globulin: 2.6 g/dL (calc) (ref 1.9–3.7)
Glucose, Bld: 83 mg/dL (ref 65–99)
Potassium: 4.9 mmol/L (ref 3.5–5.3)
Sodium: 139 mmol/L (ref 135–146)
Total Bilirubin: 0.4 mg/dL (ref 0.2–1.2)
Total Protein: 6.7 g/dL (ref 6.1–8.1)
eGFR: 98 mL/min/{1.73_m2} (ref >=60)

## 2023-04-17 LAB — HEMOGLOBIN A1C
Hgb A1c MFr Bld: 5.1 % of total Hgb (ref <5.7)
Mean Plasma Glucose: 100 mg/dL
eAG (mmol/L): 5.5 mmol/L

## 2023-04-17 LAB — CERVICOVAGINAL ANCILLARY ONLY
Bacterial Vaginitis (gardnerella): NEGATIVE
Candida Glabrata: NEGATIVE
Candida Vaginitis: POSITIVE — AB
Comment: NEGATIVE
Comment: NEGATIVE
Comment: NEGATIVE

## 2023-04-17 LAB — CBC
HCT: 41.2 % (ref 35.0–45.0)
Hemoglobin: 13.5 g/dL (ref 11.7–15.5)
MCH: 28.7 pg (ref 27.0–33.0)
MCHC: 32.8 g/dL (ref 32.0–36.0)
MCV: 87.5 fL (ref 80.0–100.0)
MPV: 10.1 fL (ref 7.5–12.5)
Platelets: 292 10*3/uL (ref 140–400)
RBC: 4.71 10*6/uL (ref 3.80–5.10)
RDW: 11.6 % (ref 11.0–15.0)
WBC: 6.9 10*3/uL (ref 3.8–10.8)

## 2023-05-01 ENCOUNTER — Other Ambulatory Visit: Payer: Self-pay | Admitting: Internal Medicine

## 2023-05-01 NOTE — Telephone Encounter (Signed)
Requested medication (s) are due for refill today: Yes  Requested medication (s) are on the active medication list: Yes  Last refill:  03/26/23  Future visit scheduled: Yes  Notes to clinic:  See request.    Requested Prescriptions  Pending Prescriptions Disp Refills   phentermine (ADIPEX-P) 37.5 MG tablet [Pharmacy Med Name: PHENTERMINE 37.5MG  TABLETS] 30 tablet     Sig: TAKE 1 TABLET(37.5 MG) BY MOUTH EVERY MORNING     Not Delegated - Neurology: Anticonvulsants - Controlled - phentermine hydrochloride Failed - 05/01/2023  6:59 AM      Failed - This refill cannot be delegated      Passed - eGFR in normal range and within 360 days    GFR, Est African American  Date Value Ref Range Status  03/19/2020 131 > OR = 60 mL/min/1.75m2 Final   GFR, Est Non African American  Date Value Ref Range Status  03/19/2020 113 > OR = 60 mL/min/1.61m2 Final   GFR  Date Value Ref Range Status  01/30/2014 127.13 >60.00 mL/min Final   eGFR  Date Value Ref Range Status  04/16/2023 98 > OR = 60 mL/min/1.55m2 Final         Passed - Cr in normal range and within 360 days    Creat  Date Value Ref Range Status  04/16/2023 0.79 0.50 - 0.97 mg/dL Final         Passed - Last BP in normal range    BP Readings from Last 1 Encounters:  04/16/23 114/72         Passed - Valid encounter within last 6 months    Recent Outpatient Visits           2 weeks ago Encounter for general adult medical examination with abnormal findings   [place] [facility] [place], Salvadore Oxford, NP   4 months ago Pure hypercholesterolemia   [place] [facility] [place], Salvadore Oxford, NP   11 months ago Morbid obesity [facility])   [place] [facility] [place], Salvadore Oxford, NP   1 year ago Encounter for general adult medical examination with abnormal findings   [place] [facility] [place], Salvadore Oxford, NP   2 years ago Acute cystitis with hematuria   [place] [facility] Smitty Cords, DO       Future Appointments             In 1 month Baity, Salvadore Oxford, NP [place] [facility]   In 11 months [place], Salvadore Oxford, NP  [facility]            Passed - Weight completed in the last 3 months    Wt Readings from Last 1 Encounters:  04/16/23 175 lb (79.4 kg)

## 2023-05-07 ENCOUNTER — Encounter: Payer: Self-pay | Admitting: Internal Medicine

## 2023-05-08 MED ORDER — FLUCONAZOLE 150 MG PO TABS: 150.0000 mg | ORAL_TABLET | Freq: Once | ORAL | 0 refills | Status: AC

## 2023-06-13 ENCOUNTER — Ambulatory Visit: Payer: Self-pay

## 2023-06-13 NOTE — Telephone Encounter (Signed)
     Chief Complaint: Left lower abdominal pain. Comes and goes. Pain getting more intense. Asking to be worked in today. Has appointment tomorrow. Work number [phone]. Symptoms: Pain, some nausea with the pain. "Pain will double me up." Frequency: Yesterday Pertinent Negatives: Patient denies diarrhea or vomiting Disposition: [] ED /[] Urgent Care (no appt availability in office) / [x] Appointment(In office/virtual)/ []  [facility]/ [] Home Care/ [] Refused Recommended Disposition /[] [place] [facility]/ []  Follow-up with PCP Additional Notes: Instructed to go to UC/ED if symptoms worsen.  Reason for Disposition  [1] MODERATE pain (e.g., interferes with normal activities) AND [2] pain comes and goes (cramps) AND [3] present > 24 hours  (Exception: Pain with Vomiting or Diarrhea - see that Guideline.)  Answer Assessment - Initial Assessment Questions 1. LOCATION: "Where does it hurt?"      Left lower 2. RADIATION: "Does the pain shoot anywhere else?" (e.g., chest, back)     Back 3. ONSET: "When did the pain begin?" (e.g., minutes, hours or days ago)      Yesterday 4. SUDDEN: "Gradual or sudden onset?"     Sudden 5. PATTERN "Does the pain come and go, or is it constant?"    - If it comes and goes: "How long does it last?" "Do you have pain now?"     (Note: Comes and goes means the pain is intermittent. It goes away completely between bouts.)    - If constant: "Is it getting better, staying the same, or getting worse?"      (Note: Constant means the pain never goes away completely; most serious pain is constant and gets worse.)      Comes and goes 6. SEVERITY: "How bad is the pain?"  (e.g., Scale 1-10; mild, moderate, or severe)    - MILD (1-3): Doesn't interfere with normal activities, abdomen soft and not tender to touch.     - MODERATE (4-7): Interferes with normal activities or awakens from sleep, abdomen tender to touch.     - SEVERE (8-10): Excruciating pain, doubled  over, unable to do any normal activities.       Severe 7. RECURRENT SYMPTOM: "Have you ever had this type of stomach pain before?" If Yes, ask: "When was the last time?" and "What happened that time?"      No 8. CAUSE: "What do you think is causing the stomach pain?"     Unsure 9. RELIEVING/AGGRAVATING FACTORS: "What makes it better or worse?" (e.g., antacids, bending or twisting motion, bowel movement)     No 10. OTHER SYMPTOMS: "Do you have any other symptoms?" (e.g., back pain, diarrhea, fever, urination pain, vomiting)       Nausea 11. PREGNANCY: "Is there any chance you are pregnant?" "When was your last menstrual period?"       No  Protocols used: Abdominal Pain - [facility]

## 2023-06-14 ENCOUNTER — Other Ambulatory Visit: Payer: Self-pay | Admitting: Internal Medicine

## 2023-06-14 ENCOUNTER — Encounter: Payer: Self-pay | Admitting: Internal Medicine

## 2023-06-14 ENCOUNTER — Emergency Department
Admission: EM | Admit: 2023-06-14 | Discharge: 2023-06-14 | Disposition: A | Discharge status: Home / Self Care | Payer: Medicaid Other | Attending: Emergency Medicine | Admitting: Emergency Medicine

## 2023-06-14 ENCOUNTER — Ambulatory Visit: Payer: Medicaid Other | Admitting: Internal Medicine

## 2023-06-14 ENCOUNTER — Other Ambulatory Visit: Payer: Self-pay

## 2023-06-14 ENCOUNTER — Ambulatory Visit
Admission: RE | Admit: 2023-06-14 | Discharge: 2023-06-14 | Disposition: A | Discharge status: Home / Self Care | Payer: Medicaid Other | Source: Ambulatory Visit | Attending: Internal Medicine | Admitting: Internal Medicine

## 2023-06-14 VITALS — BP 136/78 | HR 82 | Temp 96.2°F | Wt 171.0 lb

## 2023-06-14 DIAGNOSIS — R1032 Left lower quadrant pain: Secondary | ICD-10-CM | POA: Diagnosis not present

## 2023-06-14 DIAGNOSIS — R110 Nausea: Secondary | ICD-10-CM | POA: Insufficient documentation

## 2023-06-14 DIAGNOSIS — N200 Calculus of kidney: Secondary | ICD-10-CM

## 2023-06-14 DIAGNOSIS — N201 Calculus of ureter: Secondary | ICD-10-CM | POA: Diagnosis not present

## 2023-06-14 DIAGNOSIS — R310 Gross hematuria: Secondary | ICD-10-CM

## 2023-06-14 DIAGNOSIS — N132 Hydronephrosis with renal and ureteral calculous obstruction: Secondary | ICD-10-CM | POA: Diagnosis not present

## 2023-06-14 DIAGNOSIS — R109 Unspecified abdominal pain: Secondary | ICD-10-CM

## 2023-06-14 DIAGNOSIS — R1012 Left upper quadrant pain: Secondary | ICD-10-CM

## 2023-06-14 DIAGNOSIS — R319 Hematuria, unspecified: Secondary | ICD-10-CM | POA: Diagnosis not present

## 2023-06-14 LAB — POCT URINALYSIS DIPSTICK
Bilirubin, UA: NEGATIVE
Glucose, UA: NEGATIVE
Ketones, UA: NEGATIVE
Nitrite, UA: NEGATIVE
Protein, UA: POSITIVE — AB
Spec Grav, UA: 1.025 (ref 1.010–1.025)
Urobilinogen, UA: 0.2 E.U./dL
pH, UA: 6 (ref 5.0–8.0)

## 2023-06-14 LAB — COMPLETE METABOLIC PANEL WITH GFR
AG Ratio: 1.7 (calc) (ref 1.0–2.5)
ALT: 13 U/L (ref 6–29)
AST: 14 U/L (ref 10–30)
Albumin: 4.3 g/dL (ref 3.6–5.1)
Alkaline phosphatase (APISO): 55 U/L (ref 31–125)
BUN: 18 mg/dL (ref 7–25)
CO2: 28 mmol/L (ref 20–32)
Calcium: 9.4 mg/dL (ref 8.6–10.2)
Chloride: 102 mmol/L (ref 98–110)
Creat: 0.84 mg/dL (ref 0.50–0.97)
Globulin: 2.5 g/dL (calc) (ref 1.9–3.7)
Glucose, Bld: 79 mg/dL (ref 65–99)
Potassium: 4.1 mmol/L (ref 3.5–5.3)
Sodium: 138 mmol/L (ref 135–146)
Total Bilirubin: 0.8 mg/dL (ref 0.2–1.2)
Total Protein: 6.8 g/dL (ref 6.1–8.1)
eGFR: 91 mL/min/{1.73_m2} (ref >=60)

## 2023-06-14 LAB — URINALYSIS, ROUTINE W REFLEX MICROSCOPIC
Bilirubin Urine: NEGATIVE
Glucose, UA: NEGATIVE mg/dL
Ketones, ur: 5 mg/dL — AB
Leukocytes,Ua: NEGATIVE
Nitrite: NEGATIVE
Protein, ur: 30 mg/dL — AB
RBC / HPF: >50 RBC/hpf (ref 0–5)
Specific Gravity, Urine: 1.014 (ref 1.005–1.030)
pH: 6 (ref 5.0–8.0)

## 2023-06-14 LAB — BASIC METABOLIC PANEL
Anion gap: 7 (ref 5–15)
BUN: 18 mg/dL (ref 6–20)
CO2: 27 mmol/L (ref 22–32)
Calcium: 8.9 mg/dL (ref 8.9–10.3)
Chloride: 104 mmol/L (ref 98–111)
Creatinine, Ser: 0.84 mg/dL (ref 0.44–1.00)
GFR, Estimated: >60 mL/min (ref >60)
Glucose, Bld: 93 mg/dL (ref 70–99)
Potassium: 3.4 mmol/L — ABNORMAL LOW (ref 3.5–5.1)
Sodium: 138 mmol/L (ref 135–145)

## 2023-06-14 LAB — CBC
HCT: 40.7 % (ref 35.0–45.0)
HCT: 41.5 % (ref 36.0–46.0)
Hemoglobin: 13.5 g/dL (ref 11.7–15.5)
Hemoglobin: 13.9 g/dL (ref 12.0–15.0)
MCH: 29 pg (ref 27.0–33.0)
MCH: 29 pg (ref 26.0–34.0)
MCHC: 33.2 g/dL (ref 32.0–36.0)
MCHC: 33.5 g/dL (ref 30.0–36.0)
MCV: 87.5 fL (ref 80.0–100.0)
MCV: 86.5 fL (ref 80.0–100.0)
MPV: 10.5 fL (ref 7.5–12.5)
Platelets: 300 10*3/uL (ref 140–400)
Platelets: 280 10*3/uL (ref 150–400)
RBC: 4.65 10*6/uL (ref 3.80–5.10)
RBC: 4.8 MIL/uL (ref 3.87–5.11)
RDW: 11.8 % (ref 11.0–15.0)
RDW: 11.9 % (ref 11.5–15.5)
WBC: 6.7 10*3/uL (ref 3.8–10.8)
WBC: 8.1 10*3/uL (ref 4.0–10.5)
nRBC: 0 % (ref 0.0–0.2)

## 2023-06-14 MED ORDER — OXYCODONE-ACETAMINOPHEN 5-325 MG PO TABS: 1.0000 | ORAL_TABLET | ORAL | 0 refills | Status: DC | PRN

## 2023-06-14 MED ORDER — CEPHALEXIN 500 MG PO CAPS: 500.0000 mg | ORAL_CAPSULE | Freq: Two times a day (BID) | ORAL | 0 refills | Status: DC

## 2023-06-14 MED ORDER — ONDANSETRON 4 MG PO TBDP: 4.0000 mg | ORAL_TABLET | Freq: Three times a day (TID) | ORAL | 0 refills | Status: DC | PRN

## 2023-06-14 MED ORDER — TAMSULOSIN HCL 0.4 MG PO CAPS: 0.4000 mg | ORAL_CAPSULE | Freq: Every day | ORAL | 0 refills | Status: DC

## 2023-06-14 NOTE — Discharge Instructions (Addendum)
Please take your medications as prescribed.  Please call the number provided for urology to arrange a follow-up appointment approximately 1 week.  Please use your urine strainer to ensure passage of the stone.  Return to the emergency department for any burning with urination fever or any other symptom concerning to yourself.

## 2023-06-14 NOTE — ED Provider Notes (Signed)
[facility] Provider Note    Event Date/Time   First MD Initiated Contact with Patient 06/14/23 1554     (approximate)  History   Chief Complaint: Nephrolithiasis  HPI  Pamela Schmidt is a 37 y.o. female who presents to the emergency department for left flank pain.  According to the patient for the past 2 to 3 days she has been experiencing intermittent but severe left flank pain.  Patient went to her doctor today who ordered an outpatient CT scan.  Outpatient CT resulted showing a 4 mm left distal ureteral obstructive calculus so they sent the patient to the emergency department for further evaluation.  Here the patient states her pain is completely resolved denies any pain.  Denies any fever at any point.  Denies any dysuria but did notice some blood in her urine earlier today.  Physical Exam   Triage Vital Signs: ED Triage Vitals  Encounter Vitals Group     BP 06/14/23 1440 (!) 140/96     Systolic BP Percentile --      Diastolic BP Percentile --      Pulse Rate 06/14/23 1439 79     Resp 06/14/23 1439 16     Temp 06/14/23 1439 97.7 F (36.5 C)     Temp src --      SpO2 06/14/23 1439 100 %     Weight 06/14/23 1650 170 lb 13.7 oz (77.5 kg)     Height 06/14/23 1650 5\' 1"  (1.549 m)     Head Circumference --      Peak Flow --      Pain Score 06/14/23 1439 1     Pain Loc --      Pain Education --      Exclude from Growth Chart --     Most recent vital signs: Vitals:   06/14/23 1439 06/14/23 1440  BP:  (!) 140/96  Pulse: 79   Resp: 16   Temp: 97.7 F (36.5 C)   SpO2: 100%     General: Awake, no distress.  CV:  Good peripheral perfusion.  Regular rate and rhythm  Resp:  Normal effort.  Equal breath sounds bilaterally.  Abd:  No distention.  Soft, nontender.  No rebound or guarding.  ED Results / Procedures / Treatments   MEDICATIONS ORDERED IN ED: Medications - No data to display   IMPRESSION / MDM / ASSESSMENT AND PLAN / ED COURSE  I  reviewed the triage vital signs and the nursing notes.  Patient's presentation is most consistent with acute presentation with potential threat to life or bodily function.  Patient presents to the emergency department for left flank pain found to have a 4 mm distal ureteral stone on outpatient CT.  Patient's lab work today is reassuring with a normal CBC with normal white blood cell count, reassuring chemistry with normal renal function.  Patient's urinalysis does show red cells but also small mount of white cells and bacteria no white blood cell clumps or other signs of significant infection and is nitrite negative.  Will cover with antibiotics as a precaution.  As the patient is pain-free I believe the patient is safe for discharge home I have added a urine culture onto the patient's urinalysis.  Will discharge with Percocet to be used if needed for significant pain, Zofran to be used if nauseated, Flomax to encourage stone progression, will also discharged with Keflex.  I discussed my typical kidney stone return precautions.  Patient  will call urology to arrange a follow-up appointment in approximately 1 week.  FINAL CLINICAL IMPRESSION(S) / ED DIAGNOSES   Ureterolithiasis   Note:  This document was prepared using Dragon voice recognition software and may include unintentional dictation errors.   Minna Antis, MD 06/14/23 1752

## 2023-06-14 NOTE — Patient Instructions (Signed)
Abdominal Pain, Adult  Many things can cause belly (abdominal) pain. In most cases, belly pain is not a serious problem and can be watched and treated at home. But in some cases, it can be serious. Your doctor will try to find the cause of your belly pain. Follow these instructions at home: Medicines Take over-the-counter and prescription medicines only as told by your doctor. Do not take medicines that help you poop (laxatives) unless told by your doctor. General instructions Watch your belly pain for any changes. Tell your doctor if the pain gets worse. Drink enough fluid to keep your pee (urine) pale yellow. Contact a doctor if: Your belly pain changes or gets worse. You have very bad cramping or bloating in your belly. You vomit. Your pain gets worse with meals, after eating, or with certain foods. You have trouble pooping or have watery poop for more than 2-3 days. You are not hungry, or you lose weight without trying. You have signs of not getting enough fluid or water (dehydration). These may include: Dark pee, very little pee, or no pee. Cracked lips or dry mouth. Feeling sleepy or weak. You have pain when you pee or poop. Your belly pain wakes you up at night. You have blood in your pee. You have a fever. Get help right away if: You cannot stop vomiting. Your pain is only in one part of your belly, like on the right side. You have bloody or black poop, or poop that looks like tar. You have trouble breathing. You have chest pain. These symptoms may be an emergency. Get help right away. Call 911. Do not wait to see if the symptoms will go away. Do not drive yourself to the hospital. This information is not intended to replace advice given to you by your health care provider. Make sure you discuss any questions you have with your health care provider. Document Revised: 08/30/2022 Document Reviewed: 08/30/2022 Elsevier Patient Education  2024 [employer]  

## 2023-06-14 NOTE — Telephone Encounter (Signed)
Requested medication (s) are due for refill today -yes  Requested medication (s) are on the active medication list -yes  Future visit scheduled -yes-today  Last refill: 05/01/23 #30  Notes to clinic: non delegated Rx  Requested Prescriptions  Pending Prescriptions Disp Refills   phentermine (ADIPEX-P) 37.5 MG tablet [Pharmacy Med Name: PHENTERMINE 37.5MG  TABLETS] 30 tablet     Sig: TAKE 1 TABLET(37.5 MG) BY MOUTH EVERY MORNING     Not Delegated - Neurology: Anticonvulsants - Controlled - phentermine hydrochloride Failed - 06/14/2023  7:09 AM      Failed - This refill cannot be delegated      Passed - eGFR in normal range and within 360 days    GFR, Est African American  Date Value Ref Range Status  03/19/2020 131 > OR = 60 mL/min/1.48m2 Final   GFR, Est Non African American  Date Value Ref Range Status  03/19/2020 113 > OR = 60 mL/min/1.17m2 Final   GFR  Date Value Ref Range Status  01/30/2014 127.13 >60.00 mL/min Final   eGFR  Date Value Ref Range Status  04/16/2023 98 > OR = 60 mL/min/1.13m2 Final         Passed - Cr in normal range and within 360 days    Creat  Date Value Ref Range Status  04/16/2023 0.79 0.50 - 0.97 mg/dL Final         Passed - Last BP in normal range    BP Readings from Last 1 Encounters:  06/14/23 136/78         Passed - Valid encounter within last 6 months    Recent Outpatient Visits           Today    [facility] [place], Salvadore Oxford, NP   1 month ago Encounter for general adult medical examination with abnormal findings   [place] [facility] [place], Salvadore Oxford, NP   5 months ago Pure hypercholesterolemia   [place] [place], Salvadore Oxford, NP   1 year ago Morbid obesity [facility])   [place] [facility] [place], Salvadore Oxford, NP   1 year ago Encounter for general adult medical examination with abnormal findings   [place] [facility] [place],  Salvadore Oxford, NP       Future Appointments             In 1 week [place], Salvadore Oxford, NP [place] [facility]   In 10 months [place], Salvadore Oxford, NP [place] [facility]            Passed - Weight completed in the last 3 months    Wt Readings from Last 1 Encounters:  06/14/23 171 lb (77.6 kg)            Requested Prescriptions  Pending Prescriptions Disp Refills   phentermine (ADIPEX-P) 37.5 MG tablet [Pharmacy Med Name: PHENTERMINE 37.5MG  TABLETS] 30 tablet     Sig: TAKE 1 TABLET(37.5 MG) BY MOUTH EVERY MORNING     Not Delegated - Neurology: Anticonvulsants - Controlled - phentermine hydrochloride Failed - 06/14/2023  7:09 AM      Failed - This refill cannot be delegated      Passed - eGFR in normal range and within 360 days    GFR, Est African American  Date Value Ref Range Status  03/19/2020 131 > OR = 60 mL/min/1.8m2 Final  GFR, Est Non African American  Date Value Ref Range Status  03/19/2020 113 > OR = 60 mL/min/1.19m2 Final   GFR  Date Value Ref Range Status  01/30/2014 127.13 >60.00 mL/min Final   eGFR  Date Value Ref Range Status  04/16/2023 98 > OR = 60 mL/min/1.93m2 Final         Passed - Cr in normal range and within 360 days    Creat  Date Value Ref Range Status  04/16/2023 0.79 0.50 - 0.97 mg/dL Final         Passed - Last BP in normal range    BP Readings from Last 1 Encounters:  06/14/23 136/78         Passed - Valid encounter within last 6 months    Recent Outpatient Visits           Today    [place] [facility] [place], Salvadore Oxford, NP   1 month ago Encounter for general adult medical examination with abnormal findings   [place] [facility] [place], Salvadore Oxford, NP   5 months ago Pure hypercholesterolemia   [place] [place], Salvadore Oxford, NP   1 year ago Morbid obesity [facility])   [place] [facility] [place], Salvadore Oxford, NP    1 year ago Encounter for general adult medical examination with abnormal findings   [place] [facility] [place], Salvadore Oxford, NP       Future Appointments             In 1 week [place], Salvadore Oxford, NP [place] [facility], [facility]   In 10 months [place], Salvadore Oxford, NP [place] [facility], [facility]            Passed - Weight completed in the last 3 months    Wt Readings from Last 1 Encounters:  06/14/23 171 lb (77.6 kg)

## 2023-06-14 NOTE — Addendum Note (Signed)
Addended by: Kavin Leech E on: 06/14/2023 02:07 PM   Modules accepted: Orders

## 2023-06-14 NOTE — ED Triage Notes (Signed)
Pt to ED for left sided kidney stone, states had CT today, was told kidney stone is obstructing and to come to ER. NAD noted.

## 2023-06-14 NOTE — Progress Notes (Signed)
Subjective:    Patient ID: Geoffry Paradise, female    DOB: [birth year], 37 y.o.   MRN: [id number]  HPI  Patient presents to clinic today with complaint of left lower quadrant abdominal pain. This started 2 days ago after drinking a protein shake.  She describes the pain as sharp and shooting.  The pain radiates into her back. She reports associated sweating and nausea but denies vomiting, constipation, diarrhea, blood in her stool.  She denies urinary or vaginal symptoms. It does not seem to correlate with eating. She has a family history of diverticulitis. She still has her uterus and ovaries. She has no history of kidney stones. She has tried Ibuprofen with minimal relief of symptoms.  Review of Systems     Past Medical History:  Diagnosis Date   Anxiety    Chicken pox    History of herpes genitalis     Current Outpatient Medications  Medication Sig Dispense Refill   buPROPion (WELLBUTRIN XL) 150 MG 24 hr tablet Take 1 tablet (150 mg total) by mouth daily. 90 tablet 1   phentermine (ADIPEX-P) 37.5 MG tablet TAKE 1 TABLET(37.5 MG) BY MOUTH EVERY MORNING 30 tablet 0   No current facility-administered medications for this visit.    Allergies  Allergen Reactions   Penicillins Itching and Rash    Did it involve swelling of the face/tongue/throat, SOB, or low BP? No Did it involve sudden or severe rash/hives, skin peeling, or any reaction on the inside of your mouth or nose? Yes Did you need to seek medical attention at a hospital or doctor's office? Unknown When did it last happen?      Unknown If all above answers are "NO", may proceed with cephalosporin use.     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 She lives at home with her children. She works as a Sales executive. She enjoys working outside in the yard and shopping.   Social Determinants of Health   Financial  Resource Strain: Medium Risk (03/11/2019)   Overall Financial Resource Strain (CARDIA)    Difficulty of Paying Living Expenses: Somewhat hard  Food Insecurity: No Food Insecurity (03/18/2019)   Hunger Vital Sign    Worried About Running Out of Food in the Last Year: Never true    Ran Out of Food in the Last Year: Never true  Transportation Needs: No Transportation Needs (03/11/2019)   PRAPARE - Administrator, Civil Service (Medical): No    Lack of Transportation (Non-Medical): No  Physical Activity: Inactive (03/18/2019)   Exercise Vital Sign    Days of Exercise per Week: 0 days    Minutes of Exercise per Session: 0 min  Stress: Stress Concern Present (03/18/2019)   [name] of Occupational Health - Occupational Stress Questionnaire    Feeling of Stress : To some extent  Social Connections:  Unknown (03/11/2019)   Social Connection and Isolation Panel [NHANES]    Frequency of Communication with Friends and Family: Three times a week    Frequency of Social Gatherings with Friends and Family: Once a week    Attends Religious Services: Not on Marketing executive or Organizations: Not on file    Attends Banker Meetings: Not on file    Marital Status: Divorced  Intimate Partner Violence: Not At Risk (03/11/2019)   Humiliation, Afraid, Rape, and Kick questionnaire    Fear of Current or Ex-Partner: No    Emotionally Abused: No    Physically Abused: No    Sexually Abused: No     Constitutional: Denies fever, malaise, fatigue, headache or abrupt weight changes.  Respiratory: Denies difficulty breathing, shortness of breath, cough or sputum production.   Cardiovascular: Denies chest pain, chest tightness, palpitations or swelling in the hands or feet.  Gastrointestinal: Patient reports abdominal pain and nausea.  Denies bloating, constipation, diarrhea or blood in the stool.  GU: Denies urgency, frequency, pain with urination, burning sensation, blood in urine, odor or discharge. Musculoskeletal: Denies decrease in range of motion, difficulty with gait, muscle pain or joint pain and swelling.  Skin: Denies redness, rashes, lesions or ulcercations.   No other specific complaints in a complete review of systems (except as listed in HPI above).  Objective:   Physical Exam   BP 136/78 (BP Location: Left Arm, Patient Position: Sitting, Cuff Size: Normal)   Pulse 82   Temp (!) 96.2 F (35.7 C) (Temporal)   Wt 171 lb (77.6 kg)   SpO2 96%   BMI 32.31 kg/m   Wt Readings from Last 3 Encounters:  04/16/23 175 lb (79.4 kg)  12/22/22 184 lb (83.5 kg)  06/02/22 210 lb (95.3 kg)    General: Appears her stated age, obese, in NAD. Skin: Warm, dry and intact. No rashes noted. Cardiovascular: Normal rate and rhythm. S1,S2 noted.  No murmur, rubs or gallops  noted.  Pulmonary/Chest: Normal effort and positive vesicular breath sounds. No respiratory distress. No wheezes, rales or ronchi noted.  Abdomen: Soft and tender in the LUQ, LLQ. Normal bowel sounds. No distention or masses noted. Liver, spleen and kidneys non palpable. + CVA tenderness noted on the left. Musculoskeletal: No difficulty with gait.  Neurological: Alert and oriented.      BMET    Component Value Date/Time   NA 139 04/16/2023 0833   K 4.9 04/16/2023 0833   CL 103 04/16/2023 0833   CO2 30 04/16/2023 0833   GLUCOSE 83 04/16/2023 4540  BUN 19 04/16/2023 0833   CREATININE 0.79 04/16/2023 0833   CALCIUM 9.1 04/16/2023 0833   GFRNONAA 113 03/19/2020 0855   GFRAA 131 03/19/2020 0855    Lipid Panel     Component Value Date/Time   CHOL 171 04/16/2023 0833   TRIG 72 04/16/2023 0833   HDL 71 04/16/2023 0833   CHOLHDL 2.4 04/16/2023 0833   LDLCALC 84 04/16/2023 0833    CBC    Component Value Date/Time   WBC 6.9 04/16/2023 0833   RBC 4.71 04/16/2023 0833   HGB 13.5 04/16/2023 0833   HGB 12.9 01/14/2019 1451   HCT 41.2 04/16/2023 0833   HCT 38.6 01/14/2019 1451   PLT 292 04/16/2023 0833   PLT 241 01/14/2019 1451   MCV 87.5 04/16/2023 0833   MCV 88 01/14/2019 1451   MCV 88 03/12/2014 0153   MCH 28.7 04/16/2023 0833   MCHC 32.8 04/16/2023 0833   RDW 11.6 04/16/2023 0833   RDW 11.9 01/14/2019 1451   RDW 12.5 03/12/2014 0153   LYMPHSABS 1,531 03/19/2020 0855   MONOABS 0.7 12/29/2018 1137   EOSABS 209 03/19/2020 0855   BASOSABS 52 03/19/2020 0855    Hgb A1C Lab Results  Component Value Date   HGBA1C 5.1 04/16/2023           Assessment & Plan:    Epigastric pain, LUQ abdominal pain, LLQ abdominal pain, nausea:  Urinalysis trace leuks, large blood Will send urine culture We will check CBC, c-Met today Will obtain stat CT renal stone study to rule out kidney stone Toradol 30 mg IM x 1  Will follow-up after imaging results with further  recommendation and treatment plan  RTC in 10 month for your annual exam Nicki Reaper, NP

## 2023-06-15 ENCOUNTER — Ambulatory Visit: Payer: Medicaid Other | Admitting: Internal Medicine

## 2023-06-15 LAB — URINE CULTURE
MICRO NUMBER:: 15217710
Result:: NO GROWTH
SPECIMEN QUALITY:: ADEQUATE

## 2023-06-16 ENCOUNTER — Other Ambulatory Visit: Payer: Self-pay | Admitting: Internal Medicine

## 2023-06-18 NOTE — Telephone Encounter (Signed)
Requested Prescriptions  Pending Prescriptions Disp Refills   buPROPion (WELLBUTRIN XL) 150 MG 24 hr tablet [Pharmacy Med Name: BUPROPION XL 150MG  TABLETS (24 H)] 90 tablet 0    Sig: TAKE 1 TABLET(150 MG) BY MOUTH DAILY     Psychiatry: Antidepressants - bupropion Failed - 06/16/2023  3:13 AM      Failed - Last BP in normal range    BP Readings from Last 1 Encounters:  06/14/23 (!) 128/92         Passed - Cr in normal range and within 360 days    Creat  Date Value Ref Range Status  06/14/2023 0.84 0.50 - 0.97 mg/dL Final   Creatinine, Ser  Date Value Ref Range Status  06/14/2023 0.84 0.44 - 1.00 mg/dL Final         Passed - AST in normal range and within 360 days    AST  Date Value Ref Range Status  06/14/2023 14 10 - 30 U/L Final         Passed - ALT in normal range and within 360 days    ALT  Date Value Ref Range Status  06/14/2023 13 6 - 29 U/L Final         Passed - Valid encounter within last 6 months    Recent Outpatient Visits           4 days ago LUQ abdominal pain   [place] [facility] [place], Salvadore Oxford, NP   2 months ago Encounter for general adult medical examination with abnormal findings   [place] [facility] [place], Salvadore Oxford, NP   5 months ago Pure hypercholesterolemia   [place] [facility] [place], Salvadore Oxford, NP   1 year ago Morbid obesity [facility])   [place] [facility] [place], Salvadore Oxford, NP   1 year ago Encounter for general adult medical examination with abnormal findings   [place] [facility] [place], Salvadore Oxford, NP       Future Appointments             In 4 days [place], Salvadore Oxford, NP [place] [facility], [facility]   In 10 months [place], Salvadore Oxford, NP [facility], [facility]

## 2023-06-22 ENCOUNTER — Encounter: Payer: Medicaid Other | Admitting: Internal Medicine

## 2023-07-11 ENCOUNTER — Ambulatory Visit: Payer: Medicaid Other | Admitting: Urology

## 2023-07-19 ENCOUNTER — Ambulatory Visit
Admission: RE | Admit: 2023-07-19 | Discharge: 2023-07-19 | Disposition: A | Discharge status: Home / Self Care | Payer: Medicaid Other | Source: Ambulatory Visit | Attending: Emergency Medicine | Admitting: Emergency Medicine

## 2023-07-19 VITALS — BP 118/86 | HR 83 | Temp 98.0°F | Resp 19

## 2023-07-19 DIAGNOSIS — N200 Calculus of kidney: Secondary | ICD-10-CM

## 2023-07-19 LAB — POCT URINALYSIS DIP (MANUAL ENTRY)
Bilirubin, UA: NEGATIVE
Glucose, UA: NEGATIVE mg/dL
Ketones, POC UA: NEGATIVE mg/dL
Leukocytes, UA: NEGATIVE
Nitrite, UA: NEGATIVE
Protein Ur, POC: 30 mg/dL — AB
Spec Grav, UA: >=1.03 — AB (ref 1.010–1.025)
Urobilinogen, UA: 0.2 E.U./dL
pH, UA: 6.5 (ref 5.0–8.0)

## 2023-07-19 LAB — POCT URINE PREGNANCY: Preg Test, Ur: NEGATIVE

## 2023-07-19 MED ORDER — IBUPROFEN 800 MG PO TABS: 800.0000 mg | ORAL_TABLET | Freq: Three times a day (TID) | ORAL | 0 refills | Status: DC

## 2023-07-19 MED ORDER — OXYCODONE-ACETAMINOPHEN 5-325 MG PO TABS: 1.0000 | ORAL_TABLET | Freq: Four times a day (QID) | ORAL | 0 refills | Status: DC | PRN

## 2023-07-19 MED ORDER — TAMSULOSIN HCL 0.4 MG PO CAPS: 0.4000 mg | ORAL_CAPSULE | Freq: Every day | ORAL | 0 refills | Status: DC

## 2023-07-19 MED ORDER — ONDANSETRON 4 MG PO TBDP: 4.0000 mg | ORAL_TABLET | Freq: Three times a day (TID) | ORAL | 0 refills | Status: DC | PRN

## 2023-07-19 NOTE — ED Triage Notes (Addendum)
Patient to Urgent Care with complaints of left sided groin pain she describes as sharp and radiating into her left flank. Reports urinary urgency and frequency.   Hx of kidney stone. Reports symptoms feel the same. Pain resolved until 2-3 days ago.   Obvious discomfort during triage.

## 2023-07-19 NOTE — Discharge Instructions (Signed)
Today you were evaluated for your side pain which is most likely related to your kidney stone  Urinalysis today does not show infection  Takes Flomax daily which will help to relax the urethra making it easier for the stone to pass which will help to reduce pain  You may use ibuprofen every 8 hours as needed to help reduce pain  You may use Percocet every 6 hours as needed for severe pain, please be mindful this will make you feel sleepy   May use Zofran every 8 hours as needed for nausea or vomiting, placed under tongue and allowed to dissolve, if you begin vomiting increase your fluid intake to maintain your hydration  Your fluid intake to help flush out your kidneys and your bladder which will help stone to move and pass  You may strain your urine to see if you are able to see the stone when it is passed   Please reach out to [facility] urology today to schedule a follow-up appointment for further evaluation of your kidney stone as you may need assistance if it does not pass on its own   If your pain worsens in severity you begin to persistently vomit and medication is not effective you begin to see large amounts of blood in your urine or you begin to have high fevers please go to the nearest emergency department for immediate evaluation and imaging

## 2023-07-19 NOTE — ED Provider Notes (Signed)
Pamela Schmidt    CSN: [id number] Arrival date & time: 07/19/23  4098      History   Chief Complaint Chief Complaint  Patient presents with   Flank Pain    HPI Pamela Schmidt is a 37 y.o. female.   Patient presents for evaluation of urinary frequency, urgency and lower abdominal pressure for 4 to 5 days.  Endorses over the last 2 to 3 days she has begun to have worsening symptoms with severe left flank pain radiating to the left groin and hematuria.  Symptoms peaking today.  Endorses had similar symptoms approximately 1 month ago in which she had a 4 mm kidney stone visualized on CT imaging and a urinary infection, symptoms improved after 3 to 4 days of antibiotic usage increase fluid intake with water and lemonade.  Has not followed up with urology.  Denies dysuria, fevers, vaginal symptoms.  Has attempted use of Percocet which has been helpful.  Past Medical History:  Diagnosis Date   Anxiety    Chicken pox    History of herpes genitalis     Patient Active Problem List   Diagnosis Date Noted   Class 1 obesity due to excess calories with body mass index (BMI) of 33.0 to 33.9 in adult 03/31/2022   Anxiety 02/01/2014    Past Surgical History:  Procedure Laterality Date   CESAREAN SECTION  2005, 2010   TONSILLECTOMY      OB History     Gravida  5   Para  3   Term  3   Preterm      AB  1   Living  3      SAB  1   IAB      Ectopic      Multiple      Live Births  3            Home Medications    Prior to Admission medications   Medication Sig Start Date End Date Taking? Authorizing Provider  ibuprofen (ADVIL) 800 MG tablet Take 1 tablet (800 mg total) by mouth 3 (three) times daily. 07/19/23  Yes Aibhlinn Kalmar R, NP  ondansetron (ZOFRAN-ODT) 4 MG disintegrating tablet Take 1 tablet (4 mg total) by mouth every 8 (eight) hours as needed for nausea or vomiting. 07/19/23  Yes Rockford Leinen R, NP  oxyCODONE-acetaminophen (PERCOCET/ROXICET)  5-325 MG tablet Take 1 tablet by mouth every 6 (six) hours as needed for severe pain. 07/19/23  Yes Salli Quarry R, NP  buPROPion (WELLBUTRIN XL) 150 MG 24 hr tablet TAKE 1 TABLET(150 MG) BY MOUTH DAILY 06/18/23   Lorre Munroe, NP  phentermine (ADIPEX-P) 37.5 MG tablet TAKE 1 TABLET(37.5 MG) BY MOUTH EVERY MORNING 06/14/23   Lorre Munroe, NP  tamsulosin (FLOMAX) 0.4 MG CAPS capsule Take 1 capsule (0.4 mg total) by mouth daily. 07/19/23   Valinda Hoar, NP    Family History Family History  Problem Relation Age of Onset   Hypertension Mother    Diverticulitis Mother    Diabetes Father    Healthy Sister    Diverticulitis Maternal Grandmother    Diverticulitis Maternal Aunt    Bipolar disorder Maternal Aunt    Breast cancer Other    Ovarian cancer Neg Hx    Colon cancer Neg Hx     Social History Social History   Tobacco Use   Smoking status: Former    Current packs/day: 0.00    Types: Cigarettes  Quit date: 01/30/2010    Years since quitting: 13.4   Smokeless tobacco: Never  Vaping Use   Vaping status: Some Days  Substance Use Topics   Alcohol use: Yes    Alcohol/week: 2.0 standard drinks of alcohol    Types: 2 Glasses of wine per week    Comment: occasional   Drug use: No     Allergies   Penicillins   Review of Systems Review of Systems   Physical Exam Triage Vital Signs ED Triage Vitals  Encounter Vitals Group     BP 07/19/23 0936 118/86     Systolic BP Percentile --      Diastolic BP Percentile --      Pulse Rate 07/19/23 0936 83     Resp 07/19/23 0936 19     Temp 07/19/23 0936 98 F (36.7 C)     Temp src --      SpO2 07/19/23 0936 99 %     Weight --      Height --      Head Circumference --      Peak Flow --      Pain Score 07/19/23 0926 9     Pain Loc --      Pain Education --      Exclude from Growth Chart --    No data found.  Updated Vital Signs BP 118/86   Pulse 83   Temp 98 F (36.7 C)   Resp 19   LMP 06/28/2023  (Approximate)   SpO2 99%   Visual Acuity Right Eye Distance:   Left Eye Distance:   Bilateral Distance:    Right Eye Near:   Left Eye Near:    Bilateral Near:     Physical Exam Constitutional:      Appearance: She is ill-appearing.  Eyes:     Extraocular Movements: Extraocular movements intact.  Pulmonary:     Effort: Pulmonary effort is normal.  Abdominal:     General: Abdomen is flat. Bowel sounds are normal.     Palpations: Abdomen is soft.     Tenderness: There is abdominal tenderness in the suprapubic area and left lower quadrant. There is left CVA tenderness.  Neurological:     Mental Status: She is alert and oriented to person, place, and time. Mental status is at baseline.      UC Treatments / Results  Labs (all labs ordered are listed, but only abnormal results are displayed) Labs Reviewed  POCT URINALYSIS DIP (MANUAL ENTRY) - Abnormal; Notable for the following components:      Result Value   Clarity, UA cloudy (*)    Spec Grav, UA >=1.030 (*)    Blood, UA large (*)    Protein Ur, POC =30 (*)    All other components within normal limits  POCT URINE PREGNANCY    EKG   Radiology No results found.  Procedures Procedures (including critical care time)  Medications Ordered in UC Medications - No data to display  Initial Impression / Assessment and Plan / UC Course  I have reviewed the triage vital signs and the nursing notes.  Pertinent labs & imaging results that were available during my care of the patient were reviewed by me and considered in my medical decision making (see chart for details).  Kidney stone  Urinalysis negative, discussed findings with patient, tenderness to the suprapubic, left lower quadrant and CVA on the left side, experiencing nausea and has known history of kidney stone, presentation  is consistent with the diagnosis and will move forward with prophylactic treatment, patient is visibly uncomfortable throughout entirety of  exam, vital signs are stable at this time, prescribed Percocet, ibuprofen 800 mg, Flomax and Zofran, PDMP reviewed, low risk, advised increasing fluid intake with water and rest, advised to reach out to [facility] urology today to schedule follow-up appointment and given strict ER precautions,  work note given Final Clinical Impressions(s) / UC Diagnoses   Final diagnoses:  Kidney stone     Discharge Instructions      Today you were evaluated for your side pain which is most likely related to your kidney stone  Urinalysis today does not show infection  Takes Flomax daily which will help to relax the urethra making it easier for the stone to pass which will help to reduce pain  You may use ibuprofen every 8 hours as needed to help reduce pain  You may use Percocet every 6 hours as needed for severe pain, please be mindful this will make you feel sleepy   May use Zofran every 8 hours as needed for nausea or vomiting, placed under tongue and allowed to dissolve, if you begin vomiting increase your fluid intake to maintain your hydration  Your fluid intake to help flush out your kidneys and your bladder which will help stone to move and pass  You may strain your urine to see if you are able to see the stone when it is passed   Please reach out to [facility] urology today to schedule a follow-up appointment for further evaluation of your kidney stone as you may need assistance if it does not pass on its own   If your pain worsens in severity you begin to persistently vomit and medication is not effective you begin to see large amounts of blood in your urine or you begin to have high fevers please go to the nearest emergency department for immediate evaluation and imaging   ED Prescriptions     Medication Sig Dispense Auth. Provider   tamsulosin (FLOMAX) 0.4 MG CAPS capsule Take 1 capsule (0.4 mg total) by mouth daily. 30 capsule Violetta Lavalle R, NP   oxyCODONE-acetaminophen  (PERCOCET/ROXICET) 5-325 MG tablet Take 1 tablet by mouth every 6 (six) hours as needed for severe pain. 15 tablet Candi Profit R, NP   ondansetron (ZOFRAN-ODT) 4 MG disintegrating tablet Take 1 tablet (4 mg total) by mouth every 8 (eight) hours as needed for nausea or vomiting. 20 tablet Sharne Linders R, NP   ibuprofen (ADVIL) 800 MG tablet Take 1 tablet (800 mg total) by mouth 3 (three) times daily. 21 tablet Ezekeil Bethel, Elita Boone, NP      I have reviewed the PDMP during this encounter.   Valinda Hoar, NP 07/19/23 1001

## 2023-07-22 DIAGNOSIS — R1032 Left lower quadrant pain: Secondary | ICD-10-CM | POA: Diagnosis not present

## 2023-07-22 DIAGNOSIS — N202 Calculus of kidney with calculus of ureter: Secondary | ICD-10-CM | POA: Diagnosis not present

## 2023-07-22 DIAGNOSIS — N200 Calculus of kidney: Secondary | ICD-10-CM | POA: Diagnosis not present

## 2023-07-22 DIAGNOSIS — R319 Hematuria, unspecified: Secondary | ICD-10-CM | POA: Diagnosis not present

## 2023-07-22 DIAGNOSIS — R61 Generalized hyperhidrosis: Secondary | ICD-10-CM | POA: Diagnosis not present

## 2023-07-25 ENCOUNTER — Ambulatory Visit: Payer: Medicaid Other | Admitting: Internal Medicine

## 2023-07-25 NOTE — Progress Notes (Deleted)
Subjective:    Patient ID: Pamela Schmidt, female    DOB: 08-07-1986, 37 y.o.   MRN: [id number]  HPI  Patient presents to clinic today for multiple ER follow-up.  She presented to the ER 7/18 with complaint of left flank pain.  Outpatient CT done earlier that day showed:  IMPRESSION: 1. 4 mm obstructive calculus in the distal left ureter with mild left hydroureteronephrosis. 2. Punctate nonobstructive right nephrolithiasis.  She was treated with percocet, tamsulosin, zofran and cephalexin.  She was discharged advised to follow-up with her PCP.  She was subsequently seen in urgent care 8/22 with similar symptoms.  She was again treated with percocet, tamsulosin, zofran and ibuprofen.  She was subsequently seen at [facility] ER 8/25 for the same.  CT abdomen/pelvis showed:  IMPRESSION:  -Likely 5 mm nonobstructive ureteral calculus in the distal third of the left ureter.  -Right renal calculus.  -No hydronephrosis.   She was treated with percocet, tamsulosin, zofran, ibuprofen and pyridium.  She was referred to urology for further evaluation.  Since that time.  Review of Systems     Past Medical History:  Diagnosis Date   Anxiety    Chicken pox    History of herpes genitalis     Current Outpatient Medications  Medication Sig Dispense Refill   buPROPion (WELLBUTRIN XL) 150 MG 24 hr tablet TAKE 1 TABLET(150 MG) BY MOUTH DAILY 90 tablet 0   ibuprofen (ADVIL) 800 MG tablet Take 1 tablet (800 mg total) by mouth 3 (three) times daily. 21 tablet 0   ondansetron (ZOFRAN-ODT) 4 MG disintegrating tablet Take 1 tablet (4 mg total) by mouth every 8 (eight) hours as needed for nausea or vomiting. 20 tablet 0   oxyCODONE-acetaminophen (PERCOCET/ROXICET) 5-325 MG tablet Take 1 tablet by mouth every 6 (six) hours as needed for severe pain. 15 tablet 0   phentermine (ADIPEX-P) 37.5 MG tablet TAKE 1 TABLET(37.5 MG) BY MOUTH EVERY MORNING 30 tablet 0   tamsulosin (FLOMAX) 0.4 MG CAPS capsule Take 1 capsule  (0.4 mg total) by mouth daily. 30 capsule 0   No current facility-administered medications for this visit.    Allergies  Allergen Reactions   Penicillins Itching and Rash    Did it involve swelling of the face/tongue/throat, SOB, or low BP? No Did it involve sudden or severe rash/hives, skin peeling, or any reaction on the inside of your mouth or nose? Yes Did you need to seek medical attention at a hospital or doctor's office? Unknown When did it last happen?      Unknown If all above answers are "NO", may proceed with cephalosporin use.     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 She lives at home with her children. She works as a Sales executive. She enjoys working outside in the yard and shopping.    Social Determinants of Health   Financial Resource Strain: Medium Risk (03/11/2019)   Overall Financial Resource Strain (CARDIA)    Difficulty of Paying Living Expenses: Somewhat hard  Food Insecurity: No Food Insecurity (03/18/2019)   Hunger Vital Sign    Worried About Running Out of Food in the Last Year: Never true    Ran Out of Food in the Last Year: Never true  Transportation Needs: No Transportation Needs (03/11/2019)   PRAPARE - Administrator, Civil Service (Medical): No    Lack of Transportation (Non-Medical): No  Physical Activity: Inactive (03/18/2019)   Exercise Vital Sign    Days of Exercise per Week: 0 days    Minutes of Exercise per Session: 0 min  Stress: Stress Concern Present (03/18/2019)   [name] of Occupational Health - Occupational Stress Questionnaire    Feeling of Stress : To some extent  Social Connections: Unknown (03/11/2019)   Social Connection and Isolation Panel [NHANES]    Frequency of Communication with Friends and Family: Three times a week    Frequency of Social Gatherings with Friends and Family: Once a week    Attends Religious Services: Not on Marketing executive or Organizations: Not on file    Attends Banker Meetings: Not on file    Marital Status: Divorced  Intimate Partner Violence: Not At Risk (03/11/2019)   Humiliation, Afraid, Rape, and Kick questionnaire    Fear of Current or Ex-Partner: No    Emotionally Abused: No    Physically Abused: No    Sexually Abused: No     Constitutional: Denies fever, malaise, fatigue, headache or abrupt weight changes.  HEENT: Denies eye pain, eye redness, ear pain, ringing in the ears, wax buildup, runny nose, nasal congestion, bloody nose, or sore throat. Respiratory: Denies difficulty breathing, shortness of breath, cough or sputum production.   Cardiovascular: Denies chest pain, chest tightness, palpitations or swelling in the hands or feet.   Gastrointestinal: Denies abdominal pain, bloating, constipation, diarrhea or blood in the stool.  GU: Denies urgency, frequency, pain with urination, burning sensation, blood in urine, odor or discharge. Musculoskeletal: Denies decrease in range of motion, difficulty with gait, muscle pain or joint pain and swelling.  Skin: Denies redness, rashes, lesions or ulcercations.  Neurological: Denies dizziness, difficulty with memory, difficulty with speech or problems with balance and coordination.  Psych: Patient has a history of anxiety denies depression, SI/HI.  No other specific complaints in a complete review of systems (except as listed in HPI above).  Objective:   Physical Exam   LMP 06/28/2023 (Approximate)  Wt  Readings from Last 3 Encounters:  06/14/23 170 lb 13.7 oz (77.5 kg)  06/14/23 171 lb (77.6 kg)  04/16/23 175 lb (79.4 kg)    General: Appears their stated age, well developed, well nourished in NAD. Skin: Warm, dry and intact. No rashes, lesions or ulcerations noted. HEENT: Head: normal shape and size; Eyes: sclera white, no icterus, conjunctiva pink, PERRLA and EOMs intact; Ears: Tm's gray and intact, normal light reflex; Nose: mucosa pink and moist, septum midline; Throat/Mouth: Teeth present, mucosa pink and moist, no exudate, lesions or ulcerations noted.  Neck:  Neck supple, trachea midline. No masses, lumps or thyromegaly present.  Cardiovascular: Normal rate and rhythm. S1,S2 noted.  No murmur, rubs or gallops noted. No JVD or BLE edema. No carotid bruits noted. Pulmonary/Chest: Normal effort and positive vesicular breath sounds. No respiratory distress. No wheezes, rales or ronchi noted.  Abdomen: Soft and nontender. Normal bowel sounds. No distention or masses noted. Liver, spleen and kidneys non palpable. Musculoskeletal: Normal range of motion. No signs of joint swelling. No difficulty with gait.  Neurological: Alert and oriented. Cranial nerves II-XII grossly  intact. Coordination normal.  Psychiatric: Mood and affect normal. Behavior is normal. Judgment and thought content normal.     BMET    Component Value Date/Time   NA 138 06/14/2023 1443   K 3.4 (L) 06/14/2023 1443   CL 104 06/14/2023 1443   CO2 27 06/14/2023 1443   GLUCOSE 93 06/14/2023 1443   BUN 18 06/14/2023 1443   CREATININE 0.84 06/14/2023 1443   CREATININE 0.84 06/14/2023 1152   CALCIUM 8.9 06/14/2023 1443   GFRNONAA >60 06/14/2023 1443   GFRNONAA 113 03/19/2020 0855   GFRAA 131 03/19/2020 0855    Lipid Panel     Component Value Date/Time   CHOL 171 04/16/2023 0833   TRIG 72 04/16/2023 0833   HDL 71 04/16/2023 0833   CHOLHDL 2.4 04/16/2023 0833   LDLCALC 84 04/16/2023 0833    CBC    Component Value Date/Time   WBC 8.1 06/14/2023 1443   RBC 4.80 06/14/2023 1443   HGB 13.9 06/14/2023 1443   HGB 12.9 01/14/2019 1451   HCT 41.5 06/14/2023 1443   HCT 38.6 01/14/2019 1451   PLT 280 06/14/2023 1443   PLT 241 01/14/2019 1451   MCV 86.5 06/14/2023 1443   MCV 88 01/14/2019 1451   MCV 88 03/12/2014 0153   MCH 29.0 06/14/2023 1443   MCHC 33.5 06/14/2023 1443   RDW 11.9 06/14/2023 1443   RDW 11.9 01/14/2019 1451   RDW 12.5 03/12/2014 0153   LYMPHSABS 1,531 03/19/2020 0855   MONOABS 0.7 12/29/2018 1137   EOSABS 209 03/19/2020 0855   BASOSABS 52 03/19/2020 0855    Hgb A1C Lab Results  Component Value Date   HGBA1C 5.1 04/16/2023           Assessment & Plan:  Urgent care/ER follow-up for kidney stones:  Urgent care/ER notes, labs and imaging reviewed She has a follow-up with urology scheduled   RTC in 9 months for your annual exam Nicki Reaper, NP

## 2023-07-27 ENCOUNTER — Encounter: Payer: Self-pay | Admitting: Internal Medicine

## 2023-07-27 ENCOUNTER — Other Ambulatory Visit: Payer: Self-pay | Admitting: Internal Medicine

## 2023-07-27 NOTE — Telephone Encounter (Signed)
Requested medication (s) are due for refill today- provider review   Requested medication (s) are on the active medication list -yes  Future visit scheduled -yes  Last refill: 06/14/23 #30  Notes to clinic: non delegated Rx  Requested Prescriptions  Pending Prescriptions Disp Refills   phentermine (ADIPEX-P) 37.5 MG tablet [Pharmacy Med Name: PHENTERMINE 37.5MG  TABLETS] 30 tablet     Sig: TAKE 1 TABLET(37.5 MG) BY MOUTH EVERY MORNING     Not Delegated - Neurology: Anticonvulsants - Controlled - phentermine hydrochloride Failed - 07/27/2023  8:49 AM      Failed - This refill cannot be delegated      Passed - eGFR in normal range and within 360 days    GFR, Est African American  Date Value Ref Range Status  03/19/2020 131 > OR = 60 mL/min/1.64m2 Final   GFR, Est Non African American  Date Value Ref Range Status  03/19/2020 113 > OR = 60 mL/min/1.21m2 Final   GFR, Estimated  Date Value Ref Range Status  06/14/2023 >60 >60 mL/min Final    Comment:    (NOTE) Calculated using the CKD-EPI Creatinine Equation (2021)    GFR  Date Value Ref Range Status  01/30/2014 127.13 >60.00 mL/min Final   eGFR  Date Value Ref Range Status  06/14/2023 91 > OR = 60 mL/min/1.49m2 Final         Passed - Cr in normal range and within 360 days    Creat  Date Value Ref Range Status  06/14/2023 0.84 0.50 - 0.97 mg/dL Final   Creatinine, Ser  Date Value Ref Range Status  06/14/2023 0.84 0.44 - 1.00 mg/dL Final         Passed - Last BP in normal range    BP Readings from Last 1 Encounters:  07/19/23 118/86         Passed - Valid encounter within last 6 months    Recent Outpatient Visits           1 month ago LUQ abdominal pain   [place] [facility] [place], Salvadore Oxford, NP   3 months ago Encounter for general adult medical examination with abnormal findings   [place] [facility] [place], Salvadore Oxford, NP   7 months ago Pure hypercholesterolemia   [facility] [place], Salvadore Oxford, NP   1 year ago Morbid obesity [facility])   [place] [facility] [place], Salvadore Oxford, NP   1 year ago Encounter for general adult medical examination with abnormal findings   [place] [facility] [place], Salvadore Oxford, NP       Future Appointments             In 1 week Sondra Come, MD [facility] [place]   In 8 months Baity, Salvadore Oxford, NP [facility]            Passed - Weight completed in the last 3 months    Wt Readings from Last 1 Encounters:  06/14/23 170 lb 13.7 oz (77.5 kg)            Requested Prescriptions  Pending Prescriptions Disp Refills   phentermine (ADIPEX-P) 37.5 MG tablet [Pharmacy Med Name: PHENTERMINE 37.5MG  TABLETS] 30 tablet     Sig: TAKE 1 TABLET(37.5 MG) BY MOUTH EVERY MORNING     Not Delegated - Neurology: Anticonvulsants - Controlled - phentermine hydrochloride Failed -  07/27/2023  8:49 AM      Failed - This refill cannot be delegated      Passed - eGFR in normal range and within 360 days    GFR, Est African American  Date Value Ref Range Status  03/19/2020 131 > OR = 60 mL/min/1.56m2 Final   GFR, Est Non African American  Date Value Ref Range Status  03/19/2020 113 > OR = 60 mL/min/1.66m2 Final   GFR, Estimated  Date Value Ref Range Status  06/14/2023 >60 >60 mL/min Final    Comment:    (NOTE) Calculated using the CKD-EPI Creatinine Equation (2021)    GFR  Date Value Ref Range Status  01/30/2014 127.13 >60.00 mL/min Final   eGFR  Date Value Ref Range Status  06/14/2023 91 > OR = 60 mL/min/1.35m2 Final         Passed - Cr in normal range and within 360 days    Creat  Date Value Ref Range Status  06/14/2023 0.84 0.50 - 0.97 mg/dL Final   Creatinine, Ser  Date Value Ref Range Status  06/14/2023 0.84 0.44 - 1.00 mg/dL Final         Passed - Last BP in normal range    BP Readings from Last 1 Encounters:   07/19/23 118/86         Passed - Valid encounter within last 6 months    Recent Outpatient Visits           1 month ago LUQ abdominal pain   [place] [facility] [place], Salvadore Oxford, NP   3 months ago Encounter for general adult medical examination with abnormal findings   [place] [facility] [place], Salvadore Oxford, NP   7 months ago Pure hypercholesterolemia   [place] [facility] [place], Salvadore Oxford, NP   1 year ago Morbid obesity [facility])   [place] [facility] [place], Salvadore Oxford, NP   1 year ago Encounter for general adult medical examination with abnormal findings   [facility] [place], Salvadore Oxford, NP       Future Appointments             In 1 week Sondra Come, MD [facility] [place]   In 8 months Baity, Salvadore Oxford, NP  [facility], [facility]            Passed - Weight completed in the last 3 months    Wt Readings from Last 1 Encounters:  06/14/23 170 lb 13.7 oz (77.5 kg)

## 2023-08-07 ENCOUNTER — Ambulatory Visit
Admission: RE | Admit: 2023-08-07 | Discharge: 2023-08-07 | Disposition: A | Discharge status: Home / Self Care | Payer: Medicaid Other | Source: Ambulatory Visit | Attending: Urology

## 2023-08-07 ENCOUNTER — Ambulatory Visit (INDEPENDENT_AMBULATORY_CARE_PROVIDER_SITE_OTHER): Payer: Medicaid Other | Admitting: Urology

## 2023-08-07 ENCOUNTER — Encounter: Payer: Self-pay | Admitting: Urology

## 2023-08-07 ENCOUNTER — Ambulatory Visit
Admission: RE | Admit: 2023-08-07 | Discharge: 2023-08-07 | Disposition: A | Discharge status: Home / Self Care | Payer: Medicaid Other | Attending: Urology | Admitting: Urology

## 2023-08-07 VITALS — BP 132/86 | HR 96 | Ht 61.0 in | Wt 175.0 lb

## 2023-08-07 DIAGNOSIS — N201 Calculus of ureter: Secondary | ICD-10-CM | POA: Diagnosis not present

## 2023-08-07 DIAGNOSIS — N200 Calculus of kidney: Secondary | ICD-10-CM | POA: Insufficient documentation

## 2023-08-07 MED ORDER — KETOROLAC TROMETHAMINE 10 MG PO TABS: 10.0000 mg | ORAL_TABLET | Freq: Four times a day (QID) | ORAL | 0 refills | Status: DC | PRN

## 2023-08-07 NOTE — Progress Notes (Signed)
08/07/23 4:04 PM   Pamela Schmidt 09/21/1986 [id number]  CC: Left ureteral stone  HPI: 37 year old female who originally presented on 06/14/2023 with left-sided flank pain, and CT showed a 5 mm left mid ureteral stone.  She opted for medical expulsive therapy and symptoms initially improved, however she had multiple ER visits since that time for recurrent left-sided flank pain and worsening urinary symptoms.  Most recent ER visit was 07/22/2023 at [facility] ER, and CT at that time showed a 5 mm left distal ureteral stone, UA was noninfected, and she was discharged with ongoing medical expulsive therapy and urology follow-up recommended.  She continues to have intermittent left-sided flank pain, currently using Toradol as needed.  She also has relatively severe urinary symptoms with urgency, frequency, sensation of incomplete emptying.   PMH: Past Medical History:  Diagnosis Date   Anxiety    Chicken pox    History of herpes genitalis     Surgical History: Past Surgical History:  Procedure Laterality Date   CESAREAN SECTION  2005, 2010   TONSILLECTOMY      Family History: Family History  Problem Relation Age of Onset   Hypertension Mother    Diverticulitis Mother    Diabetes Father    Healthy Sister    Diverticulitis Maternal Grandmother    Diverticulitis Maternal Aunt    Bipolar disorder Maternal Aunt    Breast cancer Other    Ovarian cancer Neg Hx    Colon cancer Neg Hx     Social History:  reports that she quit smoking about 13 years ago. Her smoking use included cigarettes. She has been exposed to tobacco smoke. She has never used smokeless tobacco. She reports current alcohol use of about 2.0 standard drinks of alcohol per week. She reports that she does not use drugs.  Physical Exam: BP 132/86   Pulse 96   Ht 5\' 1"  (1.549 m)   Wt 175 lb (79.4 kg)   LMP 06/28/2023 (Approximate)   BMI 33.07 kg/m    Constitutional:  Alert and oriented, No acute  distress. Cardiovascular: No clubbing, cyanosis, or edema. Respiratory: Normal respiratory effort, no increased work of breathing. GI: Abdomen is soft, nontender, nondistended, no abdominal masses   Laboratory Data: Reviewed, see HPI  Pertinent Imaging: I have personally viewed and interpreted the CT from July 2024 as well as the most recent CT from [facility] on 07/22/2023 showing a 5 mm left distal ureteral stone, 900HU.  Stone clearly visible on KUB today in the left distal ureter  Assessment & Plan:   37 year old female with a 5 mm left distal ureteral stone since at least mid July 2024 with ongoing symptoms of intermittent renal colic as well as irritative urinary symptoms, no clinical or laboratory evidence of infection.  We discussed various treatment options for urolithiasis including observation with or without medical expulsive therapy, shockwave lithotripsy (SWL), ureteroscopy and laser lithotripsy with stent placement, and percutaneous nephrolithotomy.We discussed that management is based on stone size, location, density, patient co-morbidities, and patient preference. Stones <34mm in size have a >80% spontaneous passage rate. Data surrounding the use of tamsulosin for medical expulsive therapy is controversial, but meta analyses suggests it is most efficacious for distal stones between 5-17mm in size. Possible side effects include dizziness/lightheadedness, and retrograde ejaculation.SWL has a lower stone free rate in a single procedure, but also a lower complication rate compared to ureteroscopy and avoids a stent and associated stent related symptoms. Possible complications include renal hematoma, steinstrasse,  and need for additional treatment.Ureteroscopy with laser lithotripsy and stent placement has a higher stone free rate than SWL in a single procedure, however increased complication rate including possible infection, ureteral injury, bleeding, and stent related morbidity. Common  stent related symptoms include dysuria, urgency/frequency, and flank pain.  After an extensive discussion of the risks and benefits of the above treatment options, the patient would like to proceed with left shockwave lithotripsy next week.   Schedule left shockwave lithotripsy next week  Legrand Rams, MD 08/07/2023  [facility] [address] [place], [place] [address] [phone]

## 2023-08-07 NOTE — Patient Instructions (Signed)
ESWL for Kidney Stones  Extracorporeal shock wave lithotripsy (ESWL) is a treatment that can help break up kidney stones that are too large to pass on their own.  This is a nonsurgical procedure that breaks up a kidney stone with shock waves. These shock waves pass through your body and focus on the kidney stone. They cause the kidney stone to break into smaller pieces (fragments) while it is still in the urinary tract. The fragments of stone can pass more easily out of your body in the urine. Tell a health care provider about: Any allergies you have. All medicines you are taking, including vitamins, herbs, eye drops, creams, and over-the-counter medicines. Any problems you or family members have had with anesthetic medicines. Any bleeding problems you have. Any surgeries you have had. Any medical conditions you have. Whether you are pregnant or may be pregnant. What are the risks? Your health care provider will talk with you about risks. These may include: Infection. Bleeding from the kidney. Bruising of the kidney or skin. Scarring of the kidney. This can lead to: Increased blood pressure. Poor kidney function. Return (recurrence) of kidney stones. Damage to other structures or organs. This may include the liver, colon, spleen, or pancreas. Blockage (obstruction) of the tube that carries urine from the kidney to the bladder (ureter). Failure of the kidney stone to break into fragments. What happens before the procedure? When to stop eating and drinking Follow instructions from your health care provider about what you may eat and drink. These may include: 8 hours before your procedure Stop eating most foods. Do not eat meat, fried foods, or fatty foods. Eat only light foods, such as toast or crackers. All liquids are okay except energy drinks and alcohol. 6 hours before your procedure Stop eating. Drink only clear liquids, such as water, clear fruit juice, black coffee, plain tea,  and sports drinks. Do not drink energy drinks or alcohol. 2 hours before your procedure Stop drinking all liquids. You may be allowed to take medicines with small sips of water. If you do not follow your health care provider's instructions, your procedure may be delayed or canceled. Medicines Ask your health care provider about: Changing or stopping your regular medicines. These include any diabetes medicines or blood thinners you take. Taking medicines such as aspirin and ibuprofen. These medicines can thin your blood. Do not take them unless your health care provider tells you to. Taking over-the-counter medicines, vitamins, herbs, and supplements. Tests You may have tests, such as: Blood tests. Urine tests. Imaging tests. This may include a CT scan. Surgery safety Ask your health care provider: How your surgery site will be marked. What steps will be taken to help prevent infection. These steps may include: Washing skin with a soap that kills germs. Receiving antibiotics. General instructions If you will be going home right after the procedure, plan to have a responsible adult: Take you home from the hospital or clinic. You will not be allowed to drive. Care for you for the time you are told. What happens during the procedure?  An IV will be inserted into one of your veins. You may be given: A sedative. This helps you relax. Anesthesia. This will: Numb certain areas of your body. Make you fall asleep for surgery. A water-filled cushion may be placed behind your kidney or on your abdomen. In some cases, you may be placed in a tub of lukewarm water. Your body will be positioned in a way that makes it  easier to target the kidney stone. An X-ray or ultrasound exam will be done to locate your stone. Shock waves will be aimed at the stone. If you are awake, you may feel a tapping sensation as the shock waves pass through your body. A small mesh tube (stent) may be placed in your  ureter. This will help keep urine flowing from the kidney if the fragments of the stone have been blocking the ureter. The stent will be removed at a later time by your health care provider. The procedure may vary among health care providers and hospitals. What happens after the procedure? Your blood pressure, heart rate, breathing rate, and blood oxygen level will be monitored until you leave the hospital or clinic. You may have an X-ray after the procedure to see how many of the kidney stones were broken up. This will also show how much of the stone has passed. If there are still large fragments after treatment, you may need to have a second procedure at a later time. This information is not intended to replace advice given to you by your health care provider. Make sure you discuss any questions you have with your health care provider. Document Revised: 02/09/2023 Document Reviewed: 03/16/2022 Elsevier Patient Education  2024 [employer].

## 2023-08-08 ENCOUNTER — Other Ambulatory Visit: Payer: Self-pay

## 2023-08-08 DIAGNOSIS — N201 Calculus of ureter: Secondary | ICD-10-CM

## 2023-08-08 NOTE — Progress Notes (Signed)
ESWL ORDER FORM  Expected date of procedure: 08/16/2023  Surgeon: MD on truck  Post op standing: 2-4wk follow up w/KUB prior  Anticoagulation/Aspirin/NSAID standing order: Okay to continue, distal stone  Anesthesia standing order: MAC  VTE standing: SCD's  Dx: Left Ureteral Stone  Procedure: left Extracorporeal shock wave lithotripsy  CPT : 50590  Standing Order Set:   *NPO after mn, KUB  *NS 151ml/hr, Keflex 500mg  PO, Benadryl 25mg  PO, Valium 10mg  PO, Zofran 4mg  IV    Medications if other than standing orders:   NONE

## 2023-08-15 ENCOUNTER — Ambulatory Visit
Admission: RE | Admit: 2023-08-15 | Discharge: 2023-08-15 | Disposition: A | Discharge status: Home / Self Care | Payer: Medicaid Other | Attending: Physician Assistant | Admitting: Physician Assistant

## 2023-08-15 ENCOUNTER — Other Ambulatory Visit: Payer: Self-pay

## 2023-08-15 ENCOUNTER — Ambulatory Visit
Admission: RE | Admit: 2023-08-15 | Discharge: 2023-08-15 | Disposition: A | Discharge status: Home / Self Care | Payer: Medicaid Other | Source: Ambulatory Visit | Attending: Physician Assistant | Admitting: Physician Assistant

## 2023-08-15 DIAGNOSIS — N201 Calculus of ureter: Secondary | ICD-10-CM | POA: Insufficient documentation

## 2023-08-15 DIAGNOSIS — N200 Calculus of kidney: Secondary | ICD-10-CM

## 2023-08-15 NOTE — Progress Notes (Signed)
Patient states that she was using the bathroom earlier today and thinks she may have passed her stone, She will go [facility] today after 3pm when she gets off and have an X-ray to make sure. Sam to monitor for results.

## 2023-08-16 ENCOUNTER — Ambulatory Visit: Admission: RE | Admit: 2023-08-16 | Payer: Medicaid Other | Source: Home / Self Care | Admitting: Urology

## 2023-08-16 ENCOUNTER — Encounter: Admission: RE | Payer: Self-pay | Source: Home / Self Care

## 2023-08-16 SURGERY — LITHOTRIPSY, ESWL
Anesthesia: Moderate Sedation | Laterality: Left

## 2023-09-07 ENCOUNTER — Other Ambulatory Visit: Payer: Self-pay

## 2023-09-07 ENCOUNTER — Ambulatory Visit: Payer: Medicaid Other | Admitting: Physician Assistant

## 2023-09-07 ENCOUNTER — Encounter: Payer: Self-pay | Admitting: Emergency Medicine

## 2023-09-07 ENCOUNTER — Emergency Department
Admission: EM | Admit: 2023-09-07 | Discharge: 2023-09-07 | Disposition: A | Discharge status: Home / Self Care | Payer: Medicaid Other | Attending: Emergency Medicine | Admitting: Emergency Medicine

## 2023-09-07 DIAGNOSIS — F12220 Cannabis dependence with intoxication, uncomplicated: Secondary | ICD-10-CM | POA: Diagnosis present

## 2023-09-07 DIAGNOSIS — R42 Dizziness and giddiness: Secondary | ICD-10-CM | POA: Diagnosis not present

## 2023-09-07 DIAGNOSIS — T50904A Poisoning by unspecified drugs, medicaments and biological substances, undetermined, initial encounter: Secondary | ICD-10-CM | POA: Diagnosis not present

## 2023-09-07 DIAGNOSIS — R1111 Vomiting without nausea: Secondary | ICD-10-CM | POA: Diagnosis not present

## 2023-09-07 DIAGNOSIS — F12929 Cannabis use, unspecified with intoxication, unspecified: Secondary | ICD-10-CM

## 2023-09-07 DIAGNOSIS — R9431 Abnormal electrocardiogram [ECG] [EKG]: Secondary | ICD-10-CM | POA: Diagnosis not present

## 2023-09-07 DIAGNOSIS — T887XXA Unspecified adverse effect of drug or medicament, initial encounter: Secondary | ICD-10-CM | POA: Diagnosis not present

## 2023-09-07 DIAGNOSIS — R110 Nausea: Secondary | ICD-10-CM | POA: Diagnosis not present

## 2023-09-07 LAB — URINE DRUG SCREEN, QUALITATIVE (ARMC ONLY)
Amphetamines, Ur Screen: NOT DETECTED
Barbiturates, Ur Screen: NOT DETECTED
Benzodiazepine, Ur Scrn: NOT DETECTED
Cannabinoid 50 Ng, Ur ~~LOC~~: POSITIVE — AB
Cocaine Metabolite,Ur ~~LOC~~: NOT DETECTED
MDMA (Ecstasy)Ur Screen: NOT DETECTED
Methadone Scn, Ur: NOT DETECTED
Opiate, Ur Screen: NOT DETECTED
Phencyclidine (PCP) Ur S: NOT DETECTED
Tricyclic, Ur Screen: NOT DETECTED

## 2023-09-07 LAB — COMPREHENSIVE METABOLIC PANEL
ALT: 13 U/L (ref 0–44)
AST: 14 U/L — ABNORMAL LOW (ref 15–41)
Albumin: 3.9 g/dL (ref 3.5–5.0)
Alkaline Phosphatase: 45 U/L (ref 38–126)
Anion gap: 8 (ref 5–15)
BUN: 19 mg/dL (ref 6–20)
CO2: 24 mmol/L (ref 22–32)
Calcium: 8.5 mg/dL — ABNORMAL LOW (ref 8.9–10.3)
Chloride: 105 mmol/L (ref 98–111)
Creatinine, Ser: 0.67 mg/dL (ref 0.44–1.00)
GFR, Estimated: >60 mL/min (ref >60)
Glucose, Bld: 135 mg/dL — ABNORMAL HIGH (ref 70–99)
Potassium: 3.5 mmol/L (ref 3.5–5.1)
Sodium: 137 mmol/L (ref 135–145)
Total Bilirubin: 0.7 mg/dL (ref 0.3–1.2)
Total Protein: 7 g/dL (ref 6.5–8.1)

## 2023-09-07 LAB — CBC
HCT: 40.5 % (ref 36.0–46.0)
Hemoglobin: 13 g/dL (ref 12.0–15.0)
MCH: 29.1 pg (ref 26.0–34.0)
MCHC: 32.1 g/dL (ref 30.0–36.0)
MCV: 90.6 fL (ref 80.0–100.0)
Platelets: 268 10*3/uL (ref 150–400)
RBC: 4.47 MIL/uL (ref 3.87–5.11)
RDW: 12.3 % (ref 11.5–15.5)
WBC: 6.6 10*3/uL (ref 4.0–10.5)
nRBC: 0 % (ref 0.0–0.2)

## 2023-09-07 LAB — ACETAMINOPHEN LEVEL: Acetaminophen (Tylenol), Serum: <10 ug/mL — ABNORMAL LOW (ref 10–30)

## 2023-09-07 LAB — PREGNANCY, URINE: Preg Test, Ur: NEGATIVE

## 2023-09-07 LAB — SALICYLATE LEVEL: Salicylate Lvl: <7 mg/dL — ABNORMAL LOW (ref 7.0–30.0)

## 2023-09-07 LAB — ETHANOL: Alcohol, Ethyl (B): <10 mg/dL (ref <10)

## 2023-09-07 MED ORDER — SODIUM CHLORIDE 0.9 % IV BOLUS
1000.0000 mL | Freq: Once | INTRAVENOUS | Status: AC
  Administered 2023-09-07: 1000 mL via INTRAVENOUS

## 2023-09-07 MED ORDER — ONDANSETRON 4 MG PO TBDP
4.0000 mg | ORAL_TABLET | Freq: Once | ORAL | Status: AC | PRN
  Administered 2023-09-07: 4 mg via ORAL
  Filled 2023-09-07: qty 1

## 2023-09-07 MED ORDER — METOCLOPRAMIDE HCL 5 MG/ML IJ SOLN
10.0000 mg | Freq: Once | INTRAMUSCULAR | Status: AC
  Administered 2023-09-07: 10 mg via INTRAVENOUS
  Filled 2023-09-07: qty 2

## 2023-09-07 MED ORDER — LORAZEPAM 1 MG PO TABS
1.0000 mg | ORAL_TABLET | Freq: Once | ORAL | Status: AC
  Administered 2023-09-07: 1 mg via ORAL
  Filled 2023-09-07: qty 1

## 2023-09-07 NOTE — ED Notes (Signed)
See triage note  Presents with family  States she vaped some THC last pm couple of times last pm   States she just doesn't feel right  Sleepy but will answer questions  Family at bedside

## 2023-09-07 NOTE — ED Triage Notes (Signed)
Pt via [facility] from home. Pt states she hit a THC vape a couple of times and since then she has "not felt right" and nauseous. Pt is alert but slow to respond, denies any other substances taken last night. Pt is A&Ox4 but slow to answer questions.

## 2023-09-07 NOTE — ED Provider Notes (Signed)
[facility] Provider Note    Event Date/Time   First MD Initiated Contact with Patient 09/07/23 1217     (approximate)   History   Ingestion   HPI  Pamela Schmidt is a 37 y.o. female with history of anxiety presents emergency department after smoking THC vape last night.  States she had it twice and started to not feel right.  Felt nauseated.  States that it is never affected her like that before.  Woke up this morning her friend states she is very slow to respond and has difficulty standing.  She states her skin is itching and she does not feel right.  States she only uses this when her children are staying with her father every other week as it is difficult for her to sleep.  Unsure if this was a different brand or strength      Physical Exam   Triage Vital Signs: ED Triage Vitals  Encounter Vitals Group     BP 09/07/23 0940 (!) 139/107     Systolic BP Percentile --      Diastolic BP Percentile --      Pulse Rate 09/07/23 0940 72     Resp 09/07/23 0940 20     Temp 09/07/23 0940 98.4 F (36.9 C)     Temp Source 09/07/23 0940 Oral     SpO2 09/07/23 0940 100 %     Weight 09/07/23 0935 170 lb (77.1 kg)     Height 09/07/23 0935 5\' 1"  (1.549 m)     Head Circumference --      Peak Flow --      Pain Score 09/07/23 0935 0     Pain Loc --      Pain Education --      Exclude from Growth Chart --     Most recent vital signs: Vitals:   09/07/23 0940 09/07/23 1358  BP: (!) 139/107 (!) 130/90  Pulse: 72 70  Resp: 20 18  Temp: 98.4 F (36.9 C) 98 F (36.7 C)  SpO2: 100% 100%     General: Awake, no distress.   CV:  Good peripheral perfusion. regular rate and  rhythm Resp:  Normal effort. Lungs cta Abd:  No distention.   Other:  PERRL, EOMI, 4 beats nystagmus bilaterally, grips equal bilaterally, negative Romberg   ED Results / Procedures / Treatments   Labs (all labs ordered are listed, but only abnormal results are displayed) Labs  Reviewed  COMPREHENSIVE METABOLIC PANEL - Abnormal; Notable for the following components:      Result Value   Glucose, Bld 135 (*)    Calcium 8.5 (*)    AST 14 (*)    All other components within normal limits  SALICYLATE LEVEL - Abnormal; Notable for the following components:   Salicylate Lvl <7.0 (*)    All other components within normal limits  ACETAMINOPHEN LEVEL - Abnormal; Notable for the following components:   Acetaminophen (Tylenol), Serum <10 (*)    All other components within normal limits  URINE DRUG SCREEN, QUALITATIVE ([facility] ONLY) - Abnormal; Notable for the following components:   Cannabinoid 50 Ng, Ur [place] POSITIVE (*)    All other components within normal limits  ETHANOL  CBC  PREGNANCY, URINE     EKG     RADIOLOGY     PROCEDURES:   Procedures   MEDICATIONS ORDERED IN ED: Medications  ondansetron (ZOFRAN-ODT) disintegrating tablet 4 mg (4 mg Oral Given 09/07/23 1134)  sodium chloride 0.9 % bolus 1,000 mL (1,000 mLs Intravenous New Bag/Given 09/07/23 1353)  LORazepam (ATIVAN) tablet 1 mg (1 mg Oral Given 09/07/23 1352)  metoCLOPramide (REGLAN) injection 10 mg (10 mg Intravenous Given 09/07/23 1352)     IMPRESSION / MDM / ASSESSMENT AND PLAN / ED COURSE  I reviewed the triage vital signs and the nursing notes.                              Differential diagnosis includes, but is not limited to, overdose, intoxication, dehydration, anxiety,  Patient's presentation is most consistent with acute illness / injury with system symptoms.   Patient's lab's are reassuring, awaiting UDS   UDS reassuring, cannabis noted only  I did explain findings to patient.  She had been given normal saline 1 L IV, Ativan 1 mg p.o., Zofran ODT in triage, Reglan IV.  Patient states she is feeling much better.  Did explain all lab results with the patient.  She is to follow-up with regular doctor if not improving to 3 days.  Return if worsening.  Drink plenty of water.   Advised her to stop using THC.  She is in agreement treatment plan.  Discharged stable condition.   FINAL CLINICAL IMPRESSION(S) / ED DIAGNOSES   Final diagnoses:  Marijuana intoxication, with unspecified complication (HCC)     Rx / DC Orders   ED Discharge Orders     None        Note:  This document was prepared using Dragon voice recognition software and may include unintentional dictation errors.    Faythe Ghee, PA-C 09/07/23 1434    Corena Herter, MD 09/07/23 2028

## 2023-10-16 ENCOUNTER — Other Ambulatory Visit: Payer: Self-pay | Admitting: Internal Medicine

## 2023-10-17 ENCOUNTER — Encounter: Payer: Self-pay | Admitting: Internal Medicine

## 2023-10-17 NOTE — Telephone Encounter (Signed)
Requested medication (s) are due for refill today -unsure  Requested medication (s) are on the active medication list -yes  Future visit scheduled -yes  Last refill: 07/27/23 #30  Notes to clinic: non delegated Rx  Requested Prescriptions  Pending Prescriptions Disp Refills   phentermine (ADIPEX-P) 37.5 MG tablet [Pharmacy Med Name: PHENTERMINE 37.5MG  TABLETS] 30 tablet     Sig: TAKE 1 TABLET(37.5 MG) BY MOUTH EVERY MORNING     Not Delegated - Neurology: Anticonvulsants - Controlled - phentermine hydrochloride Failed - 10/16/2023  7:05 AM      Failed - This refill cannot be delegated      Failed - Last BP in normal range    BP Readings from Last 1 Encounters:  09/07/23 (!) 130/90         Failed - Valid encounter within last 6 months    Recent Outpatient Visits           4 months ago LUQ abdominal pain   [place] [facility] [place], Pamela Oxford, Pamela Schmidt   6 months ago Encounter for general adult medical examination with abnormal findings   [place] [facility] [place], Pamela Oxford, Pamela Schmidt   9 months ago Pure hypercholesterolemia   [place] [facility] [place], Pamela Schmidt   1 year ago Morbid obesity [facility])   [place] [facility] [place], Pamela Oxford, Pamela Schmidt   1 year ago Encounter for general adult medical examination with abnormal findings   [place] [facility] [place], Pamela Oxford, Pamela Schmidt       Future Appointments             In 6 months Pamela Schmidt, Pamela Oxford, Pamela Schmidt  [facility]            Passed - eGFR in normal range and within 360 days    GFR, Est African American  Date Value Ref Range Status  03/19/2020 131 > OR = 60 mL/min/1.38m2 Final   GFR, Est Non African American  Date Value Ref Range Status  03/19/2020 113 > OR = 60 mL/min/1.65m2 Final   GFR, Estimated  Date Value Ref Range Status  09/07/2023 >60 >60 mL/min Final    Comment:    (NOTE) Calculated using the CKD-EPI  Creatinine Equation (2021)    GFR  Date Value Ref Range Status  01/30/2014 127.13 >60.00 mL/min Final   eGFR  Date Value Ref Range Status  06/14/2023 91 > OR = 60 mL/min/1.31m2 Final         Passed - Cr in normal range and within 360 days    Creat  Date Value Ref Range Status  06/14/2023 0.84 0.50 - 0.97 mg/dL Final   Creatinine, Ser  Date Value Ref Range Status  09/07/2023 0.67 0.44 - 1.00 mg/dL Final         Passed - Weight completed in the last 3 months    Wt Readings from Last 1 Encounters:  09/07/23 170 lb (77.1 kg)            Requested Prescriptions  Pending Prescriptions Disp Refills   phentermine (ADIPEX-P) 37.5 MG tablet [Pharmacy Med Name: PHENTERMINE 37.5MG  TABLETS] 30 tablet     Sig: TAKE 1 TABLET(37.5 MG) BY MOUTH EVERY MORNING     Not Delegated - Neurology: Anticonvulsants - Controlled - phentermine hydrochloride Failed - 10/16/2023  7:05 AM      Failed - This refill cannot be delegated  Failed - Last BP in normal range    BP Readings from Last 1 Encounters:  09/07/23 (!) 130/90         Failed - Valid encounter within last 6 months    Recent Outpatient Visits           4 months ago LUQ abdominal pain   [place] [facility] [place], Pamela Oxford, Pamela Schmidt   6 months ago Encounter for general adult medical examination with abnormal findings   [place] [facility] [place], Pamela Oxford, Pamela Schmidt   9 months ago Pure hypercholesterolemia   [place] [facility] [place], Pamela Oxford, Pamela Schmidt   1 year ago Morbid obesity [facility])   [place] [facility] [place], Pamela Oxford, Pamela Schmidt   1 year ago Encounter for general adult medical examination with abnormal findings   [place] [facility] [place], Pamela Oxford, Pamela Schmidt       Future Appointments             In 6 months Pamela Schmidt, Pamela Oxford, Pamela Schmidt  [facility]            Passed - eGFR in normal range and within 360 days    GFR,  Est African American  Date Value Ref Range Status  03/19/2020 131 > OR = 60 mL/min/1.71m2 Final   GFR, Est Non African American  Date Value Ref Range Status  03/19/2020 113 > OR = 60 mL/min/1.35m2 Final   GFR, Estimated  Date Value Ref Range Status  09/07/2023 >60 >60 mL/min Final    Comment:    (NOTE) Calculated using the CKD-EPI Creatinine Equation (2021)    GFR  Date Value Ref Range Status  01/30/2014 127.13 >60.00 mL/min Final   eGFR  Date Value Ref Range Status  06/14/2023 91 > OR = 60 mL/min/1.42m2 Final         Passed - Cr in normal range and within 360 days    Creat  Date Value Ref Range Status  06/14/2023 0.84 0.50 - 0.97 mg/dL Final   Creatinine, Ser  Date Value Ref Range Status  09/07/2023 0.67 0.44 - 1.00 mg/dL Final         Passed - Weight completed in the last 3 months    Wt Readings from Last 1 Encounters:  09/07/23 170 lb (77.1 kg)

## 2023-11-07 ENCOUNTER — Telehealth: Payer: Medicaid Other | Admitting: Physician Assistant

## 2023-11-07 DIAGNOSIS — R3989 Other symptoms and signs involving the genitourinary system: Secondary | ICD-10-CM | POA: Diagnosis not present

## 2023-11-08 MED ORDER — NITROFURANTOIN MONOHYD MACRO 100 MG PO CAPS
100.0000 mg | ORAL_CAPSULE | Freq: Two times a day (BID) | ORAL | 0 refills | Status: DC
Start: 2023-11-08 — End: 2024-01-18

## 2023-11-08 NOTE — Progress Notes (Signed)
E-Visit for Urinary Problems ? ?We are sorry that you are not feeling well.  Here is how we plan to help! ? ?Based on what you shared with me it looks like you most likely have a simple urinary tract infection. ? ?A UTI (Urinary Tract Infection) is a bacterial infection of the bladder. ? ?Most cases of urinary tract infections are simple to treat but a key part of your care is to encourage you to drink plenty of fluids and watch your symptoms carefully. ? ?I have prescribed MacroBid 100 mg twice a day for 5 days.  Your symptoms should gradually improve. Call us if the burning in your urine worsens, you develop worsening fever, back pain or pelvic pain or if your symptoms do not resolve after completing the antibiotic. ? ?Urinary tract infections can be prevented by drinking plenty of water to keep your body hydrated.  Also be sure when you wipe, wipe from front to back and don't hold it in!  If possible, empty your bladder every 4 hours. ? ?HOME CARE ?Drink plenty of fluids ?Compete the full course of the antibiotics even if the symptoms resolve ?Remember, when you need to go?go. Holding in your urine can increase the likelihood of getting a UTI! ?GET HELP RIGHT AWAY IF: ?You cannot urinate ?You get a high fever ?Worsening back pain occurs ?You see blood in your urine ?You feel sick to your stomach or throw up ?You feel like you are going to pass out ? ?MAKE SURE YOU  ?Understand these instructions. ?Will watch your condition. ?Will get help right away if you are not doing well or get worse. ? ? ?Thank you for choosing an e-visit. ? ?Your e-visit answers were reviewed by a board certified advanced clinical practitioner to complete your personal care plan. Depending upon the condition, your plan could have included both over the counter or prescription medications. ? ?Please review your pharmacy choice. Make sure the pharmacy is open so you can pick up prescription now. If there is a problem, you may contact your  provider through MyChart messaging and have the prescription routed to another pharmacy.  Your safety is important to us. If you have drug allergies check your prescription carefully.  ? ?For the next 24 hours you can use MyChart to ask questions about today's visit, request a non-urgent call back, or ask for a work or school excuse. ?You will get an email in the next two days asking about your experience. I hope that your e-visit has been valuable and will speed your recovery.  ?

## 2023-11-08 NOTE — Progress Notes (Signed)
I have spent 5 minutes in review of e-visit questionnaire, review and updating patient chart, medical decision making and response to patient.   Mia Milan Cody Jacklynn Dehaas, PA-C    

## 2023-12-07 ENCOUNTER — Other Ambulatory Visit (HOSPITAL_COMMUNITY)
Admission: RE | Admit: 2023-12-07 | Discharge: 2023-12-07 | Disposition: A | Discharge status: Home / Self Care | Payer: Medicaid Other | Source: Ambulatory Visit | Attending: Internal Medicine | Admitting: Internal Medicine

## 2023-12-07 ENCOUNTER — Ambulatory Visit (INDEPENDENT_AMBULATORY_CARE_PROVIDER_SITE_OTHER): Payer: Medicaid Other | Admitting: Internal Medicine

## 2023-12-07 ENCOUNTER — Encounter: Payer: Self-pay | Admitting: Internal Medicine

## 2023-12-07 VITALS — BP 132/88 | Ht 61.0 in | Wt 178.0 lb

## 2023-12-07 DIAGNOSIS — N898 Other specified noninflammatory disorders of vagina: Secondary | ICD-10-CM | POA: Diagnosis not present

## 2023-12-07 LAB — POCT URINALYSIS DIPSTICK
Bilirubin, UA: NEGATIVE
Blood, UA: NEGATIVE
Glucose, UA: NEGATIVE
Ketones, UA: NEGATIVE
Leukocytes, UA: NEGATIVE
Nitrite, UA: NEGATIVE
Odor: POSITIVE
Protein, UA: POSITIVE — AB
Spec Grav, UA: 1.01 (ref 1.010–1.025)
Urobilinogen, UA: 0.2 U/dL
pH, UA: 8 (ref 5.0–8.0)

## 2023-12-07 MED ORDER — METRONIDAZOLE 0.75 % VA GEL: 1.0000 | Freq: Two times a day (BID) | VAGINAL | 0 refills | Status: DC

## 2023-12-07 MED ORDER — PHENTERMINE HCL 37.5 MG PO TABS: ORAL_TABLET | ORAL | 0 refills | Status: DC

## 2023-12-07 NOTE — Patient Instructions (Signed)
Vaginitis  Vaginitis is irritation and swelling of the vagina. Treatment will depend on the cause. What are the causes? It can be caused by: Bacteria. Yeast. A parasite. A virus. Low hormone levels. Bubble baths, scented tampons, and feminine sprays. Other things can change the balance of the yeast and bacteria that live in the vagina. These include: Antibiotic medicines. Not being clean enough. Some birth control methods. Sex. Infection. Diabetes. A weakened body defense system (immune system). What increases the risk? Smoking or being around someone who smokes. Using washes (douches), scented tampons, or scented pads. Wearing tight pants or thong underwear. Using birth control pills or an IUD. Having sex without a condom or having a lot of partners. Having an STI. Using a certain product to kill sperm (nonoxynol-9). Eating foods that are high in sugar. Having diabetes. Having low levels of a female hormone. Having a weakened body defense system. Being pregnant or breastfeeding. What are the signs or symptoms? Fluid coming from the vagina that is not normal. A bad smell. Itching, pain, or swelling. Pain with sex. Pain or burning when you pee (urinate). Sometimes there are no symptoms. How is this treated? Treatment may include: Antibiotic creams or pills. Antifungal medicines. Medicines to ease symptoms if you have a virus. Your sex partner should also be treated. Estrogen medicines. Avoiding scented soaps, sprays, or douches. Stopping use of products that caused irritation and then using a cream to treat symptoms. Follow these instructions at home: Lifestyle Keep the area around your vagina clean and dry. Avoid using soap. Rinse the area with water. Until your doctor says it is okay: Do not use washes for the vagina. Do not use tampons. Do not have sex. Wipe from front to back after going to the bathroom. When your doctor says it is okay, practice safe sex  and use condoms. General instructions Take over-the-counter and prescription medicines only as told by your doctor. If you were prescribed an antibiotic medicine, take or use it as told by your doctor. Do not stop taking or using it even if you start to feel better. Keep all follow-up visits. How is this prevented? Do not use things that can irritate the vagina, such as fabric softeners. Avoid these products if they are scented: Sprays. Detergents. Tampons. Products for cleaning the vagina. Soaps or bubble baths. Let air reach your vagina. To do this: Wear cotton underwear. Do not wear: Underwear while you sleep. Tight pants. Thong underwear. Underwear or nylons without a cotton panel. Take off any wet clothing, such as bathing suits, as soon as you can. Practice safe sex and use condoms. Contact a doctor if: You have pain in your belly or in the area between your hips. You have a fever or chills. Your symptoms last for more than 2-3 days. Get help right away if: You have a fever and your symptoms get worse all of a sudden. Summary Vaginitis is irritation and swelling of the vagina. Treatment will depend on the cause of the condition. Do not use washes or tampons or have sex until your doctor says it is okay. This information is not intended to replace advice given to you by your health care provider. Make sure you discuss any questions you have with your health care provider. Document Revised: 05/13/2020 Document Reviewed: 05/13/2020 Elsevier Patient Education  2024 [employer].

## 2023-12-07 NOTE — Progress Notes (Signed)
 Subjective:    Patient ID: Pamela Schmidt, female    DOB: October 07, 1986, 38 y.o.   MRN: [id number]  HPI  Discussed the use of AI scribe software for clinical note transcription with the patient, who gave verbal consent to proceed.  The patient, with a recent onset of vaginal discharge and odor, presents for evaluation. The discharge, described as bright white and thicker than usual, has been present for approximately two weeks. The patient denies any associated itching but reports a noticeable smell, which she finds distressing.  The patient initially thought the symptoms might be related to a urinary tract infection, as she had noticed some spotting and blood on wiping approximately two weeks ago. However, these symptoms resolved, and the discharge began. The patient denies any pelvic pain, dysuria, hematuria, fever, chills, nausea, or vomiting.  The patient reports occasional cramping, which comes and goes. She denies any recent use of antibiotics or steroids. The patient also mentions a recent change in shower gel to a product called Lume. She has not noticed any similar symptoms in the past.   Review of Systems   Past Medical History:  Diagnosis Date   Anxiety    Chicken pox    History of herpes genitalis     Current Outpatient Medications  Medication Sig Dispense Refill   buPROPion  (WELLBUTRIN  XL) 150 MG 24 hr tablet TAKE 1 TABLET(150 MG) BY MOUTH DAILY 90 tablet 0   ketorolac  (TORADOL ) 10 MG tablet Take 1 tablet (10 mg total) by mouth every 6 (six) hours as needed for severe pain. 20 tablet 0   nitrofurantoin , macrocrystal-monohydrate, (MACROBID ) 100 MG capsule Take 1 capsule (100 mg total) by mouth 2 (two) times daily. 10 capsule 0   phentermine  (ADIPEX-P ) 37.5 MG tablet TAKE 1 TABLET(37.5 MG) BY MOUTH EVERY MORNING 30 tablet 0   tamsulosin  (FLOMAX ) 0.4 MG CAPS capsule Take 1 capsule (0.4 mg total) by mouth daily. 30 capsule 0   No current facility-administered medications for  this visit.    Allergies  Allergen Reactions   Penicillins Itching and Rash    Did it involve swelling of the face/tongue/throat, SOB, or low BP? No Did it involve sudden or severe rash/hives, skin peeling, or any reaction on the inside of your mouth or nose? Yes Did you need to seek medical attention at a hospital or doctor's office? Unknown When did it last happen?      Unknown If all above answers are "NO", may proceed with cephalosporin use.     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    Comment: [place], [place]  Tobacco Use   Smoking status: Former    Current packs/day: 0.00    Types: Cigarettes    Quit date: 01/30/2010    Years since quitting: 13.8    Passive exposure:  Past   Smokeless tobacco: Never  Vaping Use   Vaping status: Some Days  Substance and Sexual Activity   Alcohol use: Yes    Alcohol/week: 2.0 standard drinks of alcohol    Types: 2 Glasses of wine per week    Comment: occasional   Drug use: No   Sexual activity: Yes    Partners: Male    Birth control/protection: None  Other Topics Concern   Not on file  Social History Narrative   Hospital Doctor was born in [place] and then moved to [place] area when she was in middle school. She lives at home with her children. She works as a Sales Executive. She enjoys working outside in the yard and shopping.   Social Drivers of Health   Financial Resource Strain: Medium Risk  (03/11/2019)   Overall Financial Resource Strain (CARDIA)    Difficulty of Paying Living Expenses: Somewhat hard  Food Insecurity: No Food Insecurity (03/18/2019)   Hunger Vital Sign    Worried About Running Out of Food in the Last Year: Never true    Ran Out of Food in the Last Year: Never true  Transportation Needs: No Transportation Needs (03/11/2019)   PRAPARE - Administrator, Civil Service (Medical): No    Lack of Transportation (Non-Medical): No  Physical Activity: Inactive (03/18/2019)   Exercise Vital Sign    Days of Exercise per Week: 0 days    Minutes of Exercise per Session: 0 min  Stress: Stress Concern Present (03/18/2019)   [name] of Occupational Health - Occupational Stress Questionnaire    Feeling of Stress : To some extent  Social Connections: Unknown (03/11/2019)   Social Connection and Isolation Panel [NHANES]    Frequency of Communication with Friends and Family: Three times a week    Frequency of Social Gatherings with Friends and Family: Once a week    Attends Religious Services: Not on Marketing Executive or Organizations: Not on file    Attends Banker Meetings: Not on file    Marital Status: Divorced  Intimate Partner Violence: Not At Risk (03/11/2019)   Humiliation, Afraid, Rape, and Kick questionnaire    Fear of Current or Ex-Partner: No    Emotionally Abused: No    Physically Abused: No    Sexually Abused: No     Constitutional: Denies fever, malaise, fatigue, headache or abrupt weight changes.  HEENT: Denies eye pain, eye redness, ear pain, ringing in the ears, wax buildup, runny nose, nasal congestion, bloody nose, or sore throat. Respiratory: Denies difficulty breathing, shortness of breath, cough or sputum production.   Cardiovascular: Denies chest pain, chest tightness, palpitations or swelling in the hands or feet.  Gastrointestinal: Pt reports pelvic cramping. Denies bloating, constipation, diarrhea or  blood in the stool.  GU: Pt reports vaginal discharge, odor. Denies urgency, frequency, pain with urination, burning sensation, blood in urine. Musculoskeletal: Denies decrease in range of motion, difficulty with gait, muscle pain or joint pain and swelling.  Skin: Denies redness, rashes, lesions or ulcercations.   No other specific complaints in a complete review of systems (except as listed in HPI above).      Objective:   Physical Exam  BP 132/88 (BP Location: Left Arm, Patient Position: Standing;Sitting, Cuff Size: Normal)   Ht 5' 1 (1.549 m)   Wt 178 lb (80.7 kg)   LMP 11/15/2023 (Approximate)   BMI 33.63 kg/m   Wt Readings from Last  3 Encounters:  09/07/23 170 lb (77.1 kg)  08/07/23 175 lb (79.4 kg)  06/14/23 170 lb 13.7 oz (77.5 kg)    General: Appears her stated age,obese, in NAD. Skin: Warm, dry and intact.  Cardiovascular: Normal rate. Pulmonary/Chest: Normal effort and positive vesicular breath sounds. No respiratory distress. No wheezes, rales or ronchi noted.  Pelvic: Self swabbed. Neurological: Alert and oriented.   BMET    Component Value Date/Time   NA 137 09/07/2023 0941   K 3.5 09/07/2023 0941   CL 105 09/07/2023 0941   CO2 24 09/07/2023 0941   GLUCOSE 135 (H) 09/07/2023 0941   BUN 19 09/07/2023 0941   CREATININE 0.67 09/07/2023 0941   CREATININE 0.84 06/14/2023 1152   CALCIUM 8.5 (L) 09/07/2023 0941   GFRNONAA >60 09/07/2023 0941   GFRNONAA 113 03/19/2020 0855   GFRAA 131 03/19/2020 0855    Lipid Panel     Component Value Date/Time   CHOL 171 04/16/2023 0833   TRIG 72 04/16/2023 0833   HDL 71 04/16/2023 0833   CHOLHDL 2.4 04/16/2023 0833   LDLCALC 84 04/16/2023 0833    CBC    Component Value Date/Time   WBC 6.6 09/07/2023 0941   RBC 4.47 09/07/2023 0941   HGB 13.0 09/07/2023 0941   HGB 12.9 01/14/2019 1451   HCT 40.5 09/07/2023 0941   HCT 38.6 01/14/2019 1451   PLT 268 09/07/2023 0941   PLT 241 01/14/2019 1451   MCV 90.6  09/07/2023 0941   MCV 88 01/14/2019 1451   MCV 88 03/12/2014 0153   MCH 29.1 09/07/2023 0941   MCHC 32.1 09/07/2023 0941   RDW 12.3 09/07/2023 0941   RDW 11.9 01/14/2019 1451   RDW 12.5 03/12/2014 0153   LYMPHSABS 1,531 03/19/2020 0855   MONOABS 0.7 12/29/2018 1137   EOSABS 209 03/19/2020 0855   BASOSABS 52 03/19/2020 0855    Hgb A1C Lab Results  Component Value Date   HGBA1C 5.1 04/16/2023            Assessment & Plan:   Assessment and Plan    Vaginal Discharge and Odor Symptoms for a couple of weeks. No itching, pelvic pain, dysuria, hematuria, fever, chills, nausea, or vomiting. Likely bacterial vaginosis (BV) based on symptoms and clinical presentation. -Perform vaginal swab for BV and yeast today. -Start Metronidazole  vaginal gel twice a day for 7 days.   RTC in 4 months for your annual exam Angeline Laura, NP

## 2023-12-11 LAB — CERVICOVAGINAL ANCILLARY ONLY
Bacterial Vaginitis (gardnerella): POSITIVE — AB
Candida Glabrata: NEGATIVE
Candida Vaginitis: NEGATIVE
Comment: NEGATIVE
Comment: NEGATIVE
Comment: NEGATIVE

## 2024-01-18 ENCOUNTER — Other Ambulatory Visit (HOSPITAL_COMMUNITY)
Admission: RE | Admit: 2024-01-18 | Discharge: 2024-01-18 | Disposition: A | Discharge status: Home / Self Care | Payer: Medicaid Other | Source: Ambulatory Visit | Attending: Internal Medicine | Admitting: Internal Medicine

## 2024-01-18 ENCOUNTER — Ambulatory Visit: Payer: Medicaid Other | Admitting: Internal Medicine

## 2024-01-18 ENCOUNTER — Encounter: Payer: Self-pay | Admitting: Internal Medicine

## 2024-01-18 VITALS — BP 130/84 | Ht 61.0 in | Wt 183.6 lb

## 2024-01-18 DIAGNOSIS — L989 Disorder of the skin and subcutaneous tissue, unspecified: Secondary | ICD-10-CM | POA: Diagnosis not present

## 2024-01-18 DIAGNOSIS — N898 Other specified noninflammatory disorders of vagina: Secondary | ICD-10-CM | POA: Insufficient documentation

## 2024-01-18 DIAGNOSIS — E66811 Obesity, class 1: Secondary | ICD-10-CM | POA: Diagnosis not present

## 2024-01-18 DIAGNOSIS — Z6834 Body mass index (BMI) 34.0-34.9, adult: Secondary | ICD-10-CM

## 2024-01-18 DIAGNOSIS — E6609 Other obesity due to excess calories: Secondary | ICD-10-CM

## 2024-01-18 MED ORDER — METRONIDAZOLE 0.75 % VA GEL: 1.0000 | Freq: Two times a day (BID) | VAGINAL | 0 refills | Status: DC

## 2024-01-18 MED ORDER — PHENTERMINE HCL 37.5 MG PO TABS: ORAL_TABLET | ORAL | 0 refills | Status: DC

## 2024-01-18 NOTE — Patient Instructions (Signed)
 Vaginal Infection (Bacterial Vaginosis): What to Know  Bacterial vaginosis is an infection of the vagina. It happens when the balance of normal germs (bacteria) in the vagina changes. If you don't get treated, it can make it easier for you to get other infections from sex. These are called sexually transmitted infections (STIs). If you're pregnant, you need to get treated right away. This infection can cause a baby to be born early or at a low birth weight. What are the causes? This infection happens when too many harmful germs grow in the vagina. You can't get this infection from toilet seats, bedsheets, swimming pools, or things that touch your vagina. What increases the risk? Having sex with a new person or more than one person. Having sex without protection. Douching. Having an intrauterine device (IUD). Smoking. Using drugs or drinking alcohol. These can lead you to do risky things. Taking certain antibiotics. Being pregnant. What are the signs or symptoms? Some females have no symptoms. Symptoms may include: A gray or white discharge from your vagina. It can be watery or foamy. A fishy smell. This can happen after sex or during your menstrual period. Itching in and around your vagina. Burning or pain when you pee. How is this treated? This infection is treated with antibiotics. These may be given to you as: A pill. A cream for your vagina. A medicine that you put into your vagina (suppository). If the infection comes back, you may need more antibiotics. Follow these instructions at home: Medicines Take your medicines as told. Take or use your antibiotics as told. Do not stop using them even if you start to feel better. General instructions If the person you have sex with is a female, tell her that you have this infection. She will need to follow up with her doctor. Female partners don't need to be treated. Do not have sex until you finish treatment. Drink more fluids as  told. Keep your vagina and butt clean. Wash these areas with warm water each day. Wipe from front to back after you poop. If you're breastfeeding a baby, talk to your doctor if you should keep doing so during treatment. How is this prevented? Self-care Do not douche. Do not use deodorant sprays on your vagina. Wear cotton underwear. Do not wear tight pants and pantyhose, especially in the summer. Safe sex Use condoms the correct way and every time you have sex. Use dental dams to protect yourself during oral sex. Limit how many people you have sex with. Get tested for STIs. The person you have sex with should also get tested. Drugs and alcohol Do not smoke, vape, or use nicotine or tobacco. Do not use drugs. Limit the amount of alcohol you drink because it can lead you to do risky things. Where to find more information To learn more: Go to TonerPromos.no. Click Health Topics A-Z. Type "bacterial vaginosis" in the search bar. American Sexual Health Association (ASHA): ashasexualhealth.org U.S. Department of Health and [employer], Office on Women's Health: TravelLesson.ca Contact a doctor if: Your symptoms don't get better, even after treatment. You have more discharge or pain when you pee. You have a fever or chills. You have pain in your belly or in the area between your hips. You have pain during sex. You bleed from your vagina between menstrual periods. This information is not intended to replace advice given to you by your health care provider. Make sure you discuss any questions you have with your health care provider. Document  Revised: 05/02/2023 Document Reviewed: 05/02/2023 Elsevier Patient Education  2024 [employer].

## 2024-01-18 NOTE — Progress Notes (Signed)
Subjective:    Patient ID: Pamela Schmidt, female    DOB: 09/05/1986, 38 y.o.   MRN: [id number]  HPI  Discussed the use of AI scribe software for clinical note transcription with the patient, who gave verbal consent to proceed.   Pamela Schmidt is a 38 year old female who presents with a rough, scaly spot on her arm and recurrent vaginal discharge.  She has a rough, scaly spot on her arm that has changed in texture and appearance. Initially, it appeared as a flat, silver scar, but after a tattoo was applied over it, the ink did not adhere well to this area. Following a touch-up around Christmas, the spot became rough, scaly, and slightly itchy. She notes difficulty in assessing the spot due to the tattoo.  She experiences recurrent vaginal discharge with a thin, white consistency, similar to previous episodes. The discharge occurs after sexual activity, and she does not use lubricants or condoms. She mentions that she is currently on her period. She has recently started taking a probiotic, which she began this week, hoping it might help with her symptoms.  She is not concerned about STDs.    She also would like refill on her phentermine today.  Her current weight is 183 pounds with BMI of 34.69  Review of Systems   Past Medical History:  Diagnosis Date   Anxiety    Chicken pox    History of herpes genitalis     Current Outpatient Medications  Medication Sig Dispense Refill   buPROPion (WELLBUTRIN XL) 150 MG 24 hr tablet TAKE 1 TABLET(150 MG) BY MOUTH DAILY (Patient not taking: Reported on 12/07/2023) 90 tablet 0   ketorolac (TORADOL) 10 MG tablet Take 1 tablet (10 mg total) by mouth every 6 (six) hours as needed for severe pain. (Patient not taking: Reported on 12/07/2023) 20 tablet 0   metroNIDAZOLE (METROGEL) 0.75 % vaginal gel Place 1 Applicatorful vaginally 2 (two) times daily. 70 g 0   nitrofurantoin, macrocrystal-monohydrate, (MACROBID) 100 MG capsule Take 1 capsule (100 mg total)  by mouth 2 (two) times daily. (Patient not taking: Reported on 12/07/2023) 10 capsule 0   phentermine (ADIPEX-P) 37.5 MG tablet TAKE 1 TABLET(37.5 MG) BY MOUTH EVERY MORNING 30 tablet 0   tamsulosin (FLOMAX) 0.4 MG CAPS capsule Take 1 capsule (0.4 mg total) by mouth daily. (Patient not taking: Reported on 12/07/2023) 30 capsule 0   No current facility-administered medications for this visit.    Allergies  Allergen Reactions   Penicillins Itching and Rash    Did it involve swelling of the face/tongue/throat, SOB, or low BP? No Did it involve sudden or severe rash/hives, skin peeling, or any reaction on the inside of your mouth or nose? Yes Did you need to seek medical attention at a hospital or doctor's office? Unknown When did it last happen?      Unknown If all above answers are "NO", may proceed with cephalosporin use.     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    Comment: [name], [place]  Tobacco Use   Smoking status: Former    Current packs/day: 0.00    Types: Cigarettes    Quit date: 01/30/2010    Years since quitting: 13.9    Passive exposure: Past   Smokeless tobacco: Never  Vaping Use   Vaping status: Some Days  Substance and Sexual Activity   Alcohol use: Yes    Alcohol/week: 2.0 standard drinks of alcohol    Types: 2 Glasses of wine per week    Comment: occasional   Drug use: No   Sexual activity: Yes    Partners: Male     Birth control/protection: None  Other Topics Concern   Not on file  Social History Narrative   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 She lives at home with her children. She works as a Sales executive. She enjoys working outside in the yard and shopping.   Social Drivers of Health   Financial Resource Strain: Medium Risk (03/11/2019)   Overall Financial Resource Strain (CARDIA)    Difficulty of Paying Living Expenses: Somewhat hard  Food Insecurity: No Food Insecurity (03/18/2019)   Hunger Vital Sign    Worried About Running Out of Food in the Last Year: Never true    Ran Out of Food in the Last Year: Never true  Transportation Needs: No Transportation Needs (03/11/2019)   PRAPARE - Administrator, Civil Service (Medical): No    Lack of Transportation (Non-Medical): No  Physical Activity: Inactive (03/18/2019)   Exercise Vital Sign    Days of Exercise per Week: 0 days    Minutes of Exercise per Session: 0 min  Stress: Stress Concern Present (03/18/2019)   [name] of Occupational Health - Occupational Stress Questionnaire    Feeling of Stress : To some extent  Social Connections: Unknown (03/11/2019)   Social Connection and Isolation Panel [NHANES]    Frequency of Communication with Friends and Family: Three times a week    Frequency of Social Gatherings with Friends and Family: Once a week    Attends Religious Services: Not on Marketing executive or Organizations: Not on file    Attends Banker Meetings: Not on file    Marital Status: Divorced  Intimate Partner Violence: Not At Risk (03/11/2019)   Humiliation, Afraid, Rape, and Kick questionnaire    Fear of Current or Ex-Partner: No    Emotionally Abused: No    Physically Abused: No    Sexually Abused: No     Constitutional: Denies fever, malaise, fatigue, headache or abrupt weight changes.  Respiratory: Denies difficulty  breathing, shortness of breath, cough or sputum production.   Cardiovascular: Denies chest pain, chest tightness, palpitations or swelling in the hands or feet.  Gastrointestinal: Denies abdominal pain, bloating, constipation, diarrhea or blood in the stool.  GU: Patient reports vaginal discharge and odor.  Denies urgency, frequency, pain with urination, burning sensation, blood in urine. Skin: Patient reports skin lesion of right forearm.  Denies redness, rashes, or ulcercations.    No other specific complaints in a complete review of systems (except as listed in HPI above).      Objective:  Physical Exam  BP 130/84 (BP Location: Left Arm, Patient Position: Sitting, Cuff Size: Normal)   Ht 5\' 1"  (1.549 m)   Wt 183 lb 9.6 oz (83.3 kg)   LMP 01/16/2024 (Approximate)   BMI 34.69 kg/m   Wt Readings from Last 3 Encounters:  12/07/23 178 lb (80.7 kg)  09/07/23 170 lb (77.1 kg)  08/07/23 175 lb (79.4 kg)    General: Appears her stated age, obese, in NAD. Skin: Warm, dry and intact.  3 mm raised irregular shaped lesion noted on the right forearm underlying type II. Cardiovascular: Normal rate. Pulmonary/Chest: Normal effort. Pelvic: Self swab.   Neurological: Alert and oriented.   BMET    Component Value Date/Time   NA 137 09/07/2023 0941   K 3.5 09/07/2023 0941   CL 105 09/07/2023 0941   CO2 24 09/07/2023 0941   GLUCOSE 135 (H) 09/07/2023 0941   BUN 19 09/07/2023 0941   CREATININE 0.67 09/07/2023 0941   CREATININE 0.84 06/14/2023 1152   CALCIUM 8.5 (L) 09/07/2023 0941   GFRNONAA >60 09/07/2023 0941   GFRNONAA 113 03/19/2020 0855   GFRAA 131 03/19/2020 0855    Lipid Panel     Component Value Date/Time   CHOL 171 04/16/2023 0833   TRIG 72 04/16/2023 0833   HDL 71 04/16/2023 0833   CHOLHDL 2.4 04/16/2023 0833   LDLCALC 84 04/16/2023 0833    CBC    Component Value Date/Time   WBC 6.6 09/07/2023 0941   RBC 4.47 09/07/2023 0941   HGB 13.0 09/07/2023 0941   HGB  12.9 01/14/2019 1451   HCT 40.5 09/07/2023 0941   HCT 38.6 01/14/2019 1451   PLT 268 09/07/2023 0941   PLT 241 01/14/2019 1451   MCV 90.6 09/07/2023 0941   MCV 88 01/14/2019 1451   MCV 88 03/12/2014 0153   MCH 29.1 09/07/2023 0941   MCHC 32.1 09/07/2023 0941   RDW 12.3 09/07/2023 0941   RDW 11.9 01/14/2019 1451   RDW 12.5 03/12/2014 0153   LYMPHSABS 1,531 03/19/2020 0855   MONOABS 0.7 12/29/2018 1137   EOSABS 209 03/19/2020 0855   BASOSABS 52 03/19/2020 0855    Hgb A1C Lab Results  Component Value Date   HGBA1C 5.1 04/16/2023            Assessment & Plan:  Assessment and Plan    Skin lesion Raised, rough, scaly lesion on tattooed area of arm. Not overly concerning but will refer to dermatology for evaluation and possible biopsy. -Refer to dermatology in [facility] for evaluation and possible biopsy.  Recurrent Vaginal Discharge Recurrent thin white discharge post-coital. No other symptoms suggestive of STI. -Collect sample for BV and yeast testing. -Rx for MetroGel 0.75% suppositories twice daily x 1 week -Recommend daily boric acid suppositories for one month post-treatment. -Continue probiotic regimen.  Class 1 Obesity:  -Phentermine refilled   RTC in 3 months for annual exam Nicki Reaper, NP

## 2024-01-22 LAB — CERVICOVAGINAL ANCILLARY ONLY
Bacterial Vaginitis (gardnerella): POSITIVE — AB
Candida Glabrata: NEGATIVE
Candida Vaginitis: NEGATIVE
Comment: NEGATIVE
Comment: NEGATIVE
Comment: NEGATIVE

## 2024-01-23 ENCOUNTER — Other Ambulatory Visit: Payer: Self-pay | Admitting: Internal Medicine

## 2024-01-23 ENCOUNTER — Encounter: Payer: Self-pay | Admitting: Internal Medicine

## 2024-03-30 ENCOUNTER — Other Ambulatory Visit: Payer: Self-pay | Admitting: Internal Medicine

## 2024-04-18 ENCOUNTER — Encounter: Payer: Self-pay | Admitting: Internal Medicine

## 2024-04-18 NOTE — Progress Notes (Deleted)
 Subjective:    Patient ID: Pamela Schmidt, female    DOB: 05/01/1986, 38 y.o.   MRN: [id number]  HPI  Patient presents to clinic today for her annual exam.   She is also due to follow-up anxiety. This is currently managed on Bupropion .  She is not currently seeing a therapist.  She denies SI/HI.  Flu: 07/2014 Tetanus: 07/2014 COVID: never Pap smear: 01/2019 Dentist: biannually  Diet: She does eat meat. She consumes fruits and veggies. She does eat fried foods. She drinks mostly water, Dt. Dr. Kathlene Schmidt. Exercise: cardio, lifting weights.  Review of Systems     Past Medical History:  Diagnosis Date   Anxiety    Chicken pox    History of herpes genitalis     Current Outpatient Medications  Medication Sig Dispense Refill   buPROPion  (WELLBUTRIN  XL) 150 MG 24 hr tablet TAKE 1 TABLET(150 MG) BY MOUTH DAILY (Patient not taking: Reported on 01/18/2024) 90 tablet 0   ketorolac  (TORADOL ) 10 MG tablet Take 1 tablet (10 mg total) by mouth every 6 (six) hours as needed for severe pain. (Patient not taking: Reported on 01/18/2024) 20 tablet 0   metroNIDAZOLE  (METROGEL ) 0.75 % vaginal gel Place 1 Applicatorful vaginally 2 (two) times daily. 70 g 0   phentermine  (ADIPEX-P ) 37.5 MG tablet TAKE 1 TABLET(37.5 MG) BY MOUTH EVERY MORNING 30 tablet 0   No current facility-administered medications for this visit.    Allergies  Allergen Reactions   Penicillins Itching and Rash    Did it involve swelling of the face/tongue/throat, SOB, or low BP? No Did it involve sudden or severe rash/hives, skin peeling, or any reaction on the inside of your mouth or nose? Yes Did you need to seek medical attention at a hospital or doctor's office? Unknown When did it last happen?      Unknown If all above answers are "NO", may proceed with cephalosporin use.     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 She lives at home with her children. She works as a Sales executive. She enjoys working outside in the yard and shopping.   Social Drivers of Health   Financial Resource Strain: Medium Risk (03/11/2019)   Overall Financial Resource Strain (CARDIA)    Difficulty of Paying Living Expenses: Somewhat hard  Food Insecurity: No Food Insecurity (03/18/2019)   Hunger Vital Sign    Worried About Running Out of Food in the Last Year: Never true    Ran Out of Food in the Last Year: Never true  Transportation Needs: No Transportation Needs (03/11/2019)   PRAPARE - Administrator, Civil Service (Medical): No    Lack of Transportation (Non-Medical): No  Physical Activity: Inactive (03/18/2019)   Exercise Vital Sign    Days of Exercise per Week: 0 days    Minutes of Exercise per  Session: 0 min  Stress: Stress Concern Present (03/18/2019)   [name] of Occupational Health - Occupational Stress Questionnaire    Feeling of Stress : To some extent  Social Connections: Unknown (03/11/2019)   Social Connection and Isolation Panel [NHANES]    Frequency of Communication with Friends and Family: Three times a week    Frequency of Social Gatherings with Friends and Family: Once a week    Attends Religious Services: Not on Marketing executive or Organizations: Not on file    Attends Banker Meetings: Not on file    Marital Status: Divorced  Intimate Partner Violence: Not At Risk (03/11/2019)   Humiliation, Afraid, Rape, and Kick questionnaire    Fear of Current or Ex-Partner: No    Emotionally Abused: No    Physically Abused: No    Sexually Abused: No     Constitutional: Denies fever, malaise, fatigue, headache or abrupt weight changes.  HEENT: Denies eye pain, eye redness, ear pain, ringing in the ears, wax buildup, runny nose, nasal congestion, bloody nose, or sore throat. Respiratory: Denies difficulty breathing, shortness of breath, cough or sputum production.   Cardiovascular: Denies chest pain, chest tightness, palpitations or swelling in the hands or feet.  Gastrointestinal: Denies abdominal pain, bloating, constipation, diarrhea or blood in the stool.  GU: Denies urgency, frequency, pain with urination, burning sensation, blood in urine, odor and discharge. Musculoskeletal: Denies decrease in range of motion, difficulty with gait, muscle pain or joint pain and swelling.  Skin: Denies redness, rashes, lesions or ulcercations.  Neurological: Denies dizziness, difficulty with memory, difficulty with speech or problems with balance and coordination.  Psych: Pt has a history of anxiety. Denies depression, SI/HI.  No other specific complaints in a complete review of systems (except as listed in HPI above).  Objective:   Physical  Exam   There were no vitals taken for this visit.  Wt Readings from Last 3 Encounters:  01/18/24 183 lb 9.6 oz (83.3 kg)  12/07/23 178 lb (80.7 kg)  09/07/23 170 lb (77.1 kg)    General: Appears her stated age, obese in NAD. Skin: Warm, dry and intact.  HEENT: Head: normal shape and size; Eyes: sclera white, no icterus, conjunctiva pink, PERRLA and EOMs intact; Neck:  Neck supple, trachea midline. No masses, lumps or thyromegaly present.  Cardiovascular: Normal rate and rhythm. S1,S2 noted.  No murmur, rubs or gallops noted. No JVD or BLE edema.  Pulmonary/Chest: Normal effort and positive vesicular breath sounds. No respiratory distress. No wheezes, rales or ronchi noted.  Abdomen:  Normal bowel sounds.  Pelvic: Self swab. Musculoskeletal: Strength 5/5 BUE/BLE.  No difficulty with gait.  Neurological: Alert and oriented. Cranial nerves II-XII grossly intact. Coordination normal.  Psychiatric: Mood and affect normal. Behavior is normal. Judgment and thought content normal.    BMET    Component Value Date/Time   NA 137 09/07/2023 0941   K 3.5 09/07/2023 0941   CL 105 09/07/2023 0941   CO2 24 09/07/2023 0941   GLUCOSE 135 (H) 09/07/2023 0941   BUN 19 09/07/2023 0941   CREATININE 0.67 09/07/2023 0941   CREATININE 0.84 06/14/2023 1152   CALCIUM 8.5 (L) 09/07/2023 0941   GFRNONAA >60 09/07/2023 0941   GFRNONAA 113 03/19/2020 0855   GFRAA 131 03/19/2020 0855    Lipid Panel     Component Value Date/Time   CHOL 171 04/16/2023 0833   TRIG 72 04/16/2023 0833   HDL 71 04/16/2023 0833   CHOLHDL 2.4 04/16/2023 0833   LDLCALC 84 04/16/2023 0833    CBC    Component Value Date/Time   WBC 6.6 09/07/2023 0941   RBC 4.47 09/07/2023 0941   HGB 13.0 09/07/2023 0941   HGB 12.9 01/14/2019 1451   HCT 40.5 09/07/2023 0941   HCT 38.6 01/14/2019 1451   PLT 268 09/07/2023 0941   PLT 241 01/14/2019 1451   MCV 90.6 09/07/2023 0941   MCV 88 01/14/2019 1451   MCV 88 03/12/2014 0153    MCH 29.1 09/07/2023 0941   MCHC 32.1 09/07/2023 0941   RDW 12.3 09/07/2023 0941   RDW 11.9 01/14/2019 1451   RDW 12.5 03/12/2014 0153   LYMPHSABS 1,531 03/19/2020 0855   MONOABS 0.7 12/29/2018 1137   EOSABS 209 03/19/2020 0855   BASOSABS 52 03/19/2020 0855    Hgb A1C Lab Results  Component Value Date   HGBA1C 5.1 04/16/2023           Assessment & Plan:   Preventative Health Maintenance:  Encouraged her to get a flu shot in the fall Tetanus UTD Encouraged her to get her COVID-vaccine Pap smear UTD Encouraged to consume a balanced diet and exercise regimen Advised her to see a dentist annually We will check CBC, c-Met, lipid, A1c today   RTC in 1 years, for your annual exam Helayne Lo, NP

## 2024-05-13 ENCOUNTER — Ambulatory Visit (INDEPENDENT_AMBULATORY_CARE_PROVIDER_SITE_OTHER): Payer: Self-pay | Admitting: Internal Medicine

## 2024-05-13 ENCOUNTER — Encounter: Payer: Self-pay | Admitting: Internal Medicine

## 2024-05-13 VITALS — BP 110/78 | Ht 61.0 in | Wt 194.6 lb

## 2024-05-13 DIAGNOSIS — R739 Hyperglycemia, unspecified: Secondary | ICD-10-CM

## 2024-05-13 DIAGNOSIS — E66812 Obesity, class 2: Secondary | ICD-10-CM

## 2024-05-13 DIAGNOSIS — Z136 Encounter for screening for cardiovascular disorders: Secondary | ICD-10-CM

## 2024-05-13 DIAGNOSIS — Z0001 Encounter for general adult medical examination with abnormal findings: Secondary | ICD-10-CM

## 2024-05-13 DIAGNOSIS — Z6836 Body mass index (BMI) 36.0-36.9, adult: Secondary | ICD-10-CM

## 2024-05-13 MED ORDER — PHENTERMINE HCL 37.5 MG PO TABS: ORAL_TABLET | ORAL | 0 refills | Status: DC

## 2024-05-13 NOTE — Assessment & Plan Note (Signed)
 Encourage diet and exercise for weight loss

## 2024-05-13 NOTE — Patient Instructions (Signed)

## 2024-05-13 NOTE — Progress Notes (Signed)
 Subjective:    Patient ID: Pamela Schmidt, female    DOB: Jun 02, 1986, 38 y.o.   MRN: [id number]  HPI  Patient presents to clinic today for her annual exam.   Flu: 07/2014 Tetanus: 07/2014 COVID: never Pap smear: 01/2019 Dentist: biannually Vision screening: annually  Diet: She does eat meat. She consumes fruits and veggies. She does eat fried foods. She drinks mostly water, Dt. Dr. Kathlene Paradise. Exercise: cardio, lifting weights.  Review of Systems     Past Medical History:  Diagnosis Date   Anxiety    Chicken pox    History of herpes genitalis     Current Outpatient Medications  Medication Sig Dispense Refill   buPROPion  (WELLBUTRIN  XL) 150 MG 24 hr tablet TAKE 1 TABLET(150 MG) BY MOUTH DAILY (Patient not taking: Reported on 01/18/2024) 90 tablet 0   ketorolac  (TORADOL ) 10 MG tablet Take 1 tablet (10 mg total) by mouth every 6 (six) hours as needed for severe pain. (Patient not taking: Reported on 01/18/2024) 20 tablet 0   metroNIDAZOLE  (METROGEL ) 0.75 % vaginal gel Place 1 Applicatorful vaginally 2 (two) times daily. 70 g 0   phentermine  (ADIPEX-P ) 37.5 MG tablet TAKE 1 TABLET(37.5 MG) BY MOUTH EVERY MORNING 30 tablet 0   No current facility-administered medications for this visit.    Allergies  Allergen Reactions   Penicillins Itching and Rash    Did it involve swelling of the face/tongue/throat, SOB, or low BP? No Did it involve sudden or severe rash/hives, skin peeling, or any reaction on the inside of your mouth or nose? Yes Did you need to seek medical attention at a hospital or doctor's office? Unknown When did it last happen?      Unknown If all above answers are "NO", may proceed with cephalosporin use.     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 She lives at home with her children. She works as a Sales executive. She enjoys working  outside in the yard and shopping.   Social Drivers of Health   Financial Resource Strain: Medium Risk (03/11/2019)   Overall Financial Resource Strain (CARDIA)    Difficulty of Paying Living Expenses: Somewhat hard  Food Insecurity: No Food Insecurity (03/18/2019)   Hunger Vital Sign    Worried About Running Out of Food in the Last Year: Never true    Ran Out of Food in the Last Year: Never true  Transportation Needs: No Transportation Needs (03/11/2019)   PRAPARE - Administrator, Civil Service (Medical): No    Lack of Transportation (Non-Medical): No  Physical Activity: Inactive (03/18/2019)   Exercise Vital Sign    Days of Exercise per Week: 0 days    Minutes of Exercise per Session: 0 min  Stress: Stress Concern Present (03/18/2019)   [name] of Occupational Health - Occupational Stress  Questionnaire    Feeling of Stress : To some extent  Social Connections: Unknown (03/11/2019)   Social Connection and Isolation Panel    Frequency of Communication with Friends and Family: Three times a week    Frequency of Social Gatherings with Friends and Family: Once a week    Attends Religious Services: Not on Marketing executive or Organizations: Not on file    Attends Banker Meetings: Not on file    Marital Status: Divorced  Intimate Partner Violence: Not At Risk (03/11/2019)   Humiliation, Afraid, Rape, and Kick questionnaire    Fear of Current or Ex-Partner: No    Emotionally Abused: No    Physically Abused: No    Sexually Abused: No     Constitutional: Denies fever, malaise, fatigue, headache or abrupt weight changes.  HEENT: Denies eye pain, eye redness, ear pain, ringing in the ears, wax buildup, runny nose, nasal congestion, bloody nose, or sore throat. Respiratory: Denies difficulty breathing, shortness of breath, cough or sputum production.   Cardiovascular: Denies chest pain, chest tightness, palpitations or swelling in the hands or feet.  Gastrointestinal: Denies abdominal pain, bloating, constipation, diarrhea or blood in the stool.  GU: Patient reports vaginal itching and discharge.  Denies urgency, frequency, pain with urination, burning sensation, blood in urine, odor. Musculoskeletal: Denies decrease in range of motion, difficulty with gait, muscle pain or joint pain and swelling.  Skin: Denies redness, rashes, lesions or ulcercations.  Neurological: Denies dizziness, difficulty with memory, difficulty with speech or problems with balance and coordination.  Psych: Pt has a history of anxiety. Denies depression, SI/HI.  No other specific complaints in a complete review of systems (except as listed in HPI above).  Objective:   Physical Exam   BP 110/78 (BP Location: Left Arm, Patient Position: Sitting, Cuff Size: Normal)   Ht 5' 1  (1.549 m)   Wt 194 lb 9.6 oz (88.3 kg)   LMP 05/12/2024 (Exact Date)   Breastfeeding No   BMI 36.77 kg/m    Wt Readings from Last 3 Encounters:  01/18/24 183 lb 9.6 oz (83.3 kg)  12/07/23 178 lb (80.7 kg)  09/07/23 170 lb (77.1 kg)    General: Appears her stated age, obese in NAD. Skin: Warm, dry and intact.  HEENT: Head: normal shape and size; Eyes: sclera white, no icterus, conjunctiva pink, PERRLA and EOMs intact; Neck:  Neck supple, trachea midline. No masses, lumps or thyromegaly present.  Cardiovascular: Normal rate and rhythm. S1,S2 noted.  No murmur, rubs or gallops noted. No JVD or BLE edema.  Pulmonary/Chest: Normal effort and positive vesicular breath sounds. No respiratory distress. No wheezes, rales or ronchi noted.  Abdomen:  Normal bowel sounds.  Musculoskeletal: Strength 5/5 BUE/BLE.  No difficulty with gait.  Neurological: Alert and oriented. Cranial nerves II-XII grossly intact. Coordination normal.  Psychiatric: Mood and affect normal. Behavior is normal. Judgment and thought content normal.    BMET    Component Value Date/Time   NA 137 09/07/2023 0941   K 3.5 09/07/2023 0941   CL 105 09/07/2023 0941   CO2 24 09/07/2023 0941   GLUCOSE 135 (H) 09/07/2023 0941   BUN 19 09/07/2023 0941   CREATININE 0.67 09/07/2023 0941   CREATININE 0.84 06/14/2023 1152   CALCIUM 8.5 (L) 09/07/2023 0941   GFRNONAA >60 09/07/2023 0941   GFRNONAA 113 03/19/2020 0855   GFRAA 131 03/19/2020 0855    Lipid Panel     Component Value Date/Time   CHOL 171 04/16/2023 0833   TRIG 72 04/16/2023 0833   HDL 71 04/16/2023 0833   CHOLHDL 2.4 04/16/2023 0833   LDLCALC 84 04/16/2023 0833    CBC    Component Value Date/Time   WBC 6.6 09/07/2023 0941   RBC 4.47 09/07/2023 0941   HGB 13.0 09/07/2023 0941   HGB 12.9 01/14/2019 1451   HCT 40.5 09/07/2023 0941   HCT 38.6 01/14/2019 1451   PLT 268 09/07/2023 0941   PLT 241 01/14/2019 1451   MCV 90.6 09/07/2023 0941   MCV 88  01/14/2019 1451   MCV 88 03/12/2014 0153   MCH 29.1 09/07/2023 0941   MCHC 32.1 09/07/2023 0941   RDW 12.3 09/07/2023 0941   RDW 11.9 01/14/2019 1451   RDW 12.5 03/12/2014 0153   LYMPHSABS 1,531 03/19/2020 0855   MONOABS 0.7 12/29/2018 1137   EOSABS 209 03/19/2020 0855   BASOSABS 52 03/19/2020 0855    Hgb A1C Lab Results  Component Value Date   HGBA1C 5.1 04/16/2023           Assessment & Plan:   Preventative Health Maintenance:  Encouraged her to get a flu shot in the fall Tetanus declined today Encouraged her to get her COVID-vaccine Pap smear  due, but she is on her menstrual cycle- will call back to schedule Encouraged to consume a balanced diet and exercise regimen Advised her to see a dentist annually We will check CBC, c-Met, lipid, A1c today   RTC in 1 years, for your annual exam Helayne Lo, NP

## 2024-05-14 ENCOUNTER — Ambulatory Visit: Payer: Self-pay | Admitting: Internal Medicine

## 2024-05-14 LAB — COMPREHENSIVE METABOLIC PANEL WITH GFR
AG Ratio: 1.6 (calc) (ref 1.0–2.5)
ALT: 22 U/L (ref 6–29)
AST: 17 U/L (ref 10–30)
Albumin: 3.8 g/dL (ref 3.6–5.1)
Alkaline phosphatase (APISO): 63 U/L (ref 31–125)
BUN: 16 mg/dL (ref 7–25)
CO2: 28 mmol/L (ref 20–32)
Calcium: 8.5 mg/dL — ABNORMAL LOW (ref 8.6–10.2)
Chloride: 103 mmol/L (ref 98–110)
Creat: 0.82 mg/dL (ref 0.50–0.99)
Globulin: 2.4 g/dL (ref 1.9–3.7)
Glucose, Bld: 105 mg/dL (ref 65–139)
Potassium: 3.7 mmol/L (ref 3.5–5.3)
Sodium: 138 mmol/L (ref 135–146)
Total Bilirubin: 0.3 mg/dL (ref 0.2–1.2)
Total Protein: 6.2 g/dL (ref 6.1–8.1)
eGFR: 93 mL/min/{1.73_m2} (ref >=60)

## 2024-05-14 LAB — HEMOGLOBIN A1C
Hgb A1c MFr Bld: 4.9 % (ref <5.7)
Mean Plasma Glucose: 94 mg/dL
eAG (mmol/L): 5.2 mmol/L

## 2024-05-14 LAB — CBC
HCT: 38 % (ref 35.0–45.0)
Hemoglobin: 12.2 g/dL (ref 11.7–15.5)
MCH: 29.5 pg (ref 27.0–33.0)
MCHC: 32.1 g/dL (ref 32.0–36.0)
MCV: 92 fL (ref 80.0–100.0)
MPV: 10.8 fL (ref 7.5–12.5)
Platelets: 257 10*3/uL (ref 140–400)
RBC: 4.13 10*6/uL (ref 3.80–5.10)
RDW: 11.3 % (ref 11.0–15.0)
WBC: 6.7 10*3/uL (ref 3.8–10.8)

## 2024-05-14 LAB — LIPID PANEL
Cholesterol: 157 mg/dL (ref <200)
HDL: 62 mg/dL (ref >=50)
LDL Cholesterol (Calc): 77 mg/dL
Non-HDL Cholesterol (Calc): 95 mg/dL (ref <130)
Total CHOL/HDL Ratio: 2.5 (calc) (ref <5.0)
Triglycerides: 92 mg/dL (ref <150)

## 2024-07-10 ENCOUNTER — Other Ambulatory Visit: Payer: Self-pay | Admitting: Internal Medicine

## 2024-07-14 NOTE — Telephone Encounter (Signed)
 Requested medication (s) are due for refill today: yes  Requested medication (s) are on the active medication list: yes  Last refill:  05/13/24  Future visit scheduled: yes  Notes to clinic:  Unable to refill per protocol, cannot delegate.      Requested Prescriptions  Pending Prescriptions Disp Refills   phentermine  (ADIPEX-P ) 37.5 MG tablet [Pharmacy Med Name: PHENTERMINE  37.5MG  TABLETS] 30 tablet     Sig: TAKE 1 TABLET(37.5 MG) BY MOUTH EVERY MORNING     Not Delegated - Neurology: Anticonvulsants - Controlled - phentermine  hydrochloride Failed - 07/14/2024  9:54 AM      Failed - This refill cannot be delegated      Passed - eGFR in normal range and within 360 days    GFR, Est African American  Date Value Ref Range Status  03/19/2020 131 > OR = 60 mL/min/1.48m2 Final   GFR, Est Non African American  Date Value Ref Range Status  03/19/2020 113 > OR = 60 mL/min/1.9m2 Final   GFR, Estimated  Date Value Ref Range Status  09/07/2023 >60 >60 mL/min Final    Comment:    (NOTE) Calculated using the CKD-EPI Creatinine Equation (2021)    GFR  Date Value Ref Range Status  01/30/2014 127.13 >60.00 mL/min Final   eGFR  Date Value Ref Range Status  05/13/2024 93 > OR = 60 mL/min/1.39m2 Final         Passed - Cr in normal range and within 360 days    Creat  Date Value Ref Range Status  05/13/2024 0.82 0.50 - 0.99 mg/dL Final         Passed - Last BP in normal range    BP Readings from Last 1 Encounters:  05/13/24 110/78         Passed - Valid encounter within last 6 months    Recent Outpatient Visits           2 months ago Encounter for general adult medical examination with abnormal findings   [place] [facility] [place], Angeline ORN, NP   5 months ago Vaginal discharge   [place] [facility] [place], [place] W, NP              Passed - Weight completed in the last 3 months    Wt Readings from Last 1 Encounters:  05/13/24  194 lb 9.6 oz (88.3 kg)

## 2024-09-17 ENCOUNTER — Encounter: Payer: Self-pay | Admitting: Internal Medicine

## 2024-09-17 MED ORDER — PHENTERMINE HCL 37.5 MG PO TABS: ORAL_TABLET | ORAL | 0 refills | Status: AC

## 2024-12-16 ENCOUNTER — Other Ambulatory Visit: Payer: Self-pay | Admitting: Internal Medicine

## 2024-12-17 NOTE — Telephone Encounter (Signed)
 Requested medication (s) are due for refill today: yes  Requested medication (s) are on the active medication list: yes  Last refill:  09/17/24  Future visit scheduled: {Yes  Notes to clinic:  Unable to refill per protocol, cannot delegate.      Requested Prescriptions  Pending Prescriptions Disp Refills   phentermine  (ADIPEX-P ) 37.5 MG tablet [Pharmacy Med Name: PHENTERMINE  37.5MG  TABLETS] 30 tablet     Sig: TAKE 1 TABLET(37.5 MG) BY MOUTH EVERY MORNING     Not Delegated - Neurology: Anticonvulsants - Controlled - phentermine  hydrochloride Failed - 12/17/2024 11:16 AM      Failed - This refill cannot be delegated      Failed - Valid encounter within last 6 months    Recent Outpatient Visits           7 months ago Encounter for general adult medical examination with abnormal findings   [place] [facility] [place], Angeline ORN, NP   11 months ago Vaginal discharge   [place] [facility] [place], [place] W, NP              Failed - Weight completed in the last 3 months    Wt Readings from Last 1 Encounters:  05/13/24 194 lb 9.6 oz (88.3 kg)         Passed - eGFR in normal range and within 360 days    GFR, Est African American  Date Value Ref Range Status  03/19/2020 131 > OR = 60 mL/min/1.64m2 Final   GFR, Est Non African American  Date Value Ref Range Status  03/19/2020 113 > OR = 60 mL/min/1.6m2 Final   GFR, Estimated  Date Value Ref Range Status  09/07/2023 >60 >60 mL/min Final    Comment:    (NOTE) Calculated using the CKD-EPI Creatinine Equation (2021)    GFR  Date Value Ref Range Status  01/30/2014 127.13 >60.00 mL/min Final   eGFR  Date Value Ref Range Status  05/13/2024 93 > OR = 60 mL/min/1.8m2 Final         Passed - Cr in normal range and within 360 days    Creat  Date Value Ref Range Status  05/13/2024 0.82 0.50 - 0.99 mg/dL Final         Passed - Last BP in normal range    BP Readings from Last 1  Encounters:  05/13/24 110/78

## 2024-12-18 ENCOUNTER — Encounter: Payer: Self-pay | Admitting: Internal Medicine

## 2025-01-01 NOTE — Progress Notes (Unsigned)
 "  Subjective:    Patient ID: Pamela Schmidt, female    DOB: Mar 03, 1986, 39 y.o.   MRN: [id number]  HPI    Review of Systems     Past Medical History:  Diagnosis Date   Anxiety    Chicken pox    History of herpes genitalis     Current Outpatient Medications  Medication Sig Dispense Refill   phentermine  (ADIPEX-P ) 37.5 MG tablet TAKE 1 TABLET(37.5 MG) BY MOUTH EVERY MORNING 30 tablet 0   No current facility-administered medications for this visit.    Allergies  Allergen Reactions   Penicillins Itching and Rash    Did it involve swelling of the face/tongue/throat, SOB, or low BP? No Did it involve sudden or severe rash/hives, skin peeling, or any reaction on the inside of your mouth or nose? Yes Did you need to seek medical attention at a hospital or doctor's office? Unknown When did it last happen?      Unknown If all above answers are NO, may proceed with cephalosporin use.     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 She lives at home with her children. She works as a Sales Executive. She enjoys working outside in the yard and shopping.   Social Drivers of Health   Tobacco Use: Medium Risk (05/13/2024)   Patient History    Smoking Tobacco Use: Former    Smokeless Tobacco Use: Never    Passive Exposure: Past  Physicist, Medical Strain: Not on file  Food Insecurity: Not on file  Transportation Needs: Not on file  Physical Activity: Not on file  Stress: Not on file  Social Connections: Not on file  Intimate Partner Violence: Not on file  Depression (PHQ2-9): Medium Risk (05/13/2024)   Depression (PHQ2-9)    PHQ-2 Score: 5  Alcohol Screen: Low Risk (04/16/2023)   Alcohol Screen    Last Alcohol Screening Score (AUDIT): 6  Housing: Not on file  Utilities:  Not on file  Health Literacy: Not on file     Constitutional: Denies fever, malaise, fatigue, headache or abrupt weight changes.  HEENT: Denies eye pain, eye redness, ear pain, ringing in the ears, wax buildup, runny nose, nasal congestion, bloody nose, or sore throat. Respiratory: Denies difficulty breathing, shortness of breath, cough or sputum production.   Cardiovascular: Denies chest pain, chest tightness, palpitations or swelling in the hands or feet.  Gastrointestinal: Denies abdominal pain, bloating, constipation, diarrhea or blood in the stool.  GU: Patient reports vaginal itching and discharge.  Denies urgency, frequency, pain with urination, burning sensation, blood in urine, odor. Musculoskeletal: Denies decrease in range of motion, difficulty with gait, muscle pain or joint pain and swelling.  Skin: Denies redness, rashes, lesions or ulcercations.  Neurological: Denies  dizziness, difficulty with memory, difficulty with speech or problems with balance and coordination.  Psych: Pt has a history of anxiety. Denies depression, SI/HI.  No other specific complaints in a complete review of systems (except as listed in HPI above).  Objective:   Physical Exam   There were no vitals taken for this visit.   Wt Readings from Last 3 Encounters:  05/13/24 194 lb 9.6 oz (88.3 kg)  01/18/24 183 lb 9.6 oz (83.3 kg)  12/07/23 178 lb (80.7 kg)    General: Appears her stated age, obese in NAD. Skin: Warm, dry and intact.  HEENT: Head: normal shape and size; Eyes: sclera white, no icterus, conjunctiva pink, PERRLA and EOMs intact; Neck:  Neck supple, trachea midline. No masses, lumps or thyromegaly present.  Cardiovascular: Normal rate and rhythm. S1,S2 noted.  No murmur, rubs or gallops noted. No JVD or BLE edema.  Pulmonary/Chest: Normal effort and positive vesicular breath sounds. No respiratory distress. No wheezes, rales or ronchi noted.  Abdomen:  Normal bowel sounds.  Musculoskeletal: Strength 5/5 BUE/BLE.  No difficulty with gait.  Neurological: Alert and oriented. Cranial nerves II-XII grossly intact. Coordination normal.  Psychiatric: Mood and affect normal. Behavior is normal. Judgment and thought content normal.    BMET    Component Value Date/Time   NA 138 05/13/2024 1519   K 3.7 05/13/2024 1519   CL 103 05/13/2024 1519   CO2 28 05/13/2024 1519   GLUCOSE 105 05/13/2024 1519   BUN 16 05/13/2024 1519   CREATININE 0.82 05/13/2024 1519   CALCIUM 8.5 (L) 05/13/2024 1519   GFRNONAA >60 09/07/2023 0941   GFRNONAA 113 03/19/2020 0855   GFRAA 131 03/19/2020 0855    Lipid Panel     Component Value Date/Time   CHOL 157 05/13/2024 1519   TRIG 92 05/13/2024 1519   HDL 62 05/13/2024 1519   CHOLHDL 2.5 05/13/2024 1519   LDLCALC 77 05/13/2024 1519    CBC    Component Value Date/Time   WBC 6.7 05/13/2024 1519   RBC 4.13 05/13/2024 1519   HGB  12.2 05/13/2024 1519   HGB 12.9 01/14/2019 1451   HCT 38.0 05/13/2024 1519   HCT 38.6 01/14/2019 1451   PLT 257 05/13/2024 1519   PLT 241 01/14/2019 1451   MCV 92.0 05/13/2024 1519   MCV 88 01/14/2019 1451   MCV 88 03/12/2014 0153   MCH 29.5 05/13/2024 1519   MCHC 32.1 05/13/2024 1519   RDW 11.3 05/13/2024 1519   RDW 11.9 01/14/2019 1451   RDW 12.5 03/12/2014 0153   LYMPHSABS 1,531 03/19/2020 0855   MONOABS 0.7 12/29/2018 1137   EOSABS 209 03/19/2020 0855   BASOSABS 52 03/19/2020 0855  Hgb A1C Lab Results  Component Value Date   HGBA1C 4.9 05/13/2024           Assessment & Plan:      RTC in 5 months for your annual exam Angeline Laura, NP  "

## 2025-01-02 ENCOUNTER — Ambulatory Visit: Payer: Self-pay | Admitting: Internal Medicine

## 2025-01-13 ENCOUNTER — Ambulatory Visit: Payer: Self-pay | Admitting: Internal Medicine

## 2025-05-15 ENCOUNTER — Encounter: Admitting: Internal Medicine
